# Patient Record
Sex: Female | Born: 1955 | Race: White | Hispanic: No | Marital: Married | Smoking: Never smoker
Health system: Southern US, Community
[De-identification: ages and names within clinical notes are randomized; demographics above are authoritative.]

## PROBLEM LIST (undated history)

## (undated) DIAGNOSIS — J45909 Unspecified asthma, uncomplicated: Secondary | ICD-10-CM

## (undated) DIAGNOSIS — D649 Anemia, unspecified: Secondary | ICD-10-CM

## (undated) DIAGNOSIS — T7840XA Allergy, unspecified, initial encounter: Secondary | ICD-10-CM

## (undated) DIAGNOSIS — K8020 Calculus of gallbladder without cholecystitis without obstruction: Secondary | ICD-10-CM

## (undated) DIAGNOSIS — M8580 Other specified disorders of bone density and structure, unspecified site: Secondary | ICD-10-CM

## (undated) DIAGNOSIS — D0362 Melanoma in situ of left upper limb, including shoulder: Secondary | ICD-10-CM

## (undated) DIAGNOSIS — M1990 Unspecified osteoarthritis, unspecified site: Secondary | ICD-10-CM

## (undated) DIAGNOSIS — K219 Gastro-esophageal reflux disease without esophagitis: Secondary | ICD-10-CM

## (undated) DIAGNOSIS — C801 Malignant (primary) neoplasm, unspecified: Secondary | ICD-10-CM

## (undated) HISTORY — DX: Gastro-esophageal reflux disease without esophagitis: K21.9

## (undated) HISTORY — PX: OTHER SURGICAL HISTORY: SHX169

## (undated) HISTORY — DX: Melanoma in situ of left upper limb, including shoulder: D03.62

## (undated) HISTORY — DX: Unspecified osteoarthritis, unspecified site: M19.90

## (undated) HISTORY — DX: Unspecified asthma, uncomplicated: J45.909

## (undated) HISTORY — PX: ABDOMINAL HYSTERECTOMY: SHX81

## (undated) HISTORY — PX: MELANOMA EXCISION: SHX5266

## (undated) HISTORY — DX: Malignant (primary) neoplasm, unspecified: C80.1

## (undated) HISTORY — DX: Allergy, unspecified, initial encounter: T78.40XA

## (undated) HISTORY — DX: Calculus of gallbladder without cholecystitis without obstruction: K80.20

## (undated) HISTORY — DX: Other specified disorders of bone density and structure, unspecified site: M85.80

## (undated) HISTORY — DX: Anemia, unspecified: D64.9

---

## 1998-11-16 ENCOUNTER — Other Ambulatory Visit: Admission: RE | Admit: 1998-11-16 | Discharge: 1998-11-16 | Payer: Self-pay | Admitting: *Deleted

## 1999-12-04 ENCOUNTER — Other Ambulatory Visit: Admission: RE | Admit: 1999-12-04 | Discharge: 1999-12-04 | Payer: Self-pay | Admitting: *Deleted

## 2000-08-22 ENCOUNTER — Ambulatory Visit (HOSPITAL_COMMUNITY): Admission: RE | Admit: 2000-08-22 | Discharge: 2000-08-22 | Payer: Self-pay | Admitting: Plastic Surgery

## 2000-08-22 ENCOUNTER — Encounter: Payer: Self-pay | Admitting: Plastic Surgery

## 2000-12-03 ENCOUNTER — Other Ambulatory Visit: Admission: RE | Admit: 2000-12-03 | Discharge: 2000-12-03 | Payer: Self-pay | Admitting: *Deleted

## 2001-11-27 ENCOUNTER — Other Ambulatory Visit: Admission: RE | Admit: 2001-11-27 | Discharge: 2001-11-27 | Payer: Self-pay | Admitting: *Deleted

## 2002-07-13 ENCOUNTER — Encounter (INDEPENDENT_AMBULATORY_CARE_PROVIDER_SITE_OTHER): Payer: Self-pay | Admitting: Specialist

## 2002-07-13 ENCOUNTER — Ambulatory Visit (HOSPITAL_BASED_OUTPATIENT_CLINIC_OR_DEPARTMENT_OTHER): Admission: RE | Admit: 2002-07-13 | Discharge: 2002-07-13 | Payer: Self-pay | Admitting: Plastic Surgery

## 2002-12-02 ENCOUNTER — Other Ambulatory Visit: Admission: RE | Admit: 2002-12-02 | Discharge: 2002-12-02 | Payer: Self-pay | Admitting: *Deleted

## 2003-12-01 ENCOUNTER — Other Ambulatory Visit: Admission: RE | Admit: 2003-12-01 | Discharge: 2003-12-01 | Payer: Self-pay | Admitting: *Deleted

## 2004-10-09 ENCOUNTER — Emergency Department (HOSPITAL_COMMUNITY): Admission: EM | Admit: 2004-10-09 | Discharge: 2004-10-09 | Payer: Self-pay | Admitting: Emergency Medicine

## 2004-12-04 ENCOUNTER — Other Ambulatory Visit: Admission: RE | Admit: 2004-12-04 | Discharge: 2004-12-04 | Payer: Self-pay | Admitting: *Deleted

## 2005-12-03 ENCOUNTER — Other Ambulatory Visit: Admission: RE | Admit: 2005-12-03 | Discharge: 2005-12-03 | Payer: Self-pay | Admitting: *Deleted

## 2006-12-04 ENCOUNTER — Other Ambulatory Visit: Admission: RE | Admit: 2006-12-04 | Discharge: 2006-12-04 | Payer: Self-pay | Admitting: *Deleted

## 2007-12-08 ENCOUNTER — Other Ambulatory Visit: Admission: RE | Admit: 2007-12-08 | Discharge: 2007-12-08 | Payer: Self-pay | Admitting: Gynecology

## 2007-12-11 ENCOUNTER — Ambulatory Visit: Payer: Self-pay | Admitting: Gastroenterology

## 2007-12-23 ENCOUNTER — Ambulatory Visit: Payer: Self-pay | Admitting: Gastroenterology

## 2007-12-31 ENCOUNTER — Telehealth: Payer: Self-pay | Admitting: Gastroenterology

## 2008-01-26 DIAGNOSIS — K5909 Other constipation: Secondary | ICD-10-CM

## 2008-01-26 DIAGNOSIS — R519 Headache, unspecified: Secondary | ICD-10-CM | POA: Insufficient documentation

## 2008-01-26 DIAGNOSIS — R51 Headache: Secondary | ICD-10-CM

## 2008-01-26 DIAGNOSIS — J45909 Unspecified asthma, uncomplicated: Secondary | ICD-10-CM | POA: Insufficient documentation

## 2008-01-27 ENCOUNTER — Ambulatory Visit: Payer: Self-pay | Admitting: Gastroenterology

## 2008-01-27 DIAGNOSIS — K5900 Constipation, unspecified: Secondary | ICD-10-CM | POA: Insufficient documentation

## 2008-01-27 DIAGNOSIS — C439 Malignant melanoma of skin, unspecified: Secondary | ICD-10-CM | POA: Insufficient documentation

## 2008-01-27 LAB — CONVERTED CEMR LAB
ALT: 15 units/L (ref 0–35)
AST: 23 units/L (ref 0–37)
Albumin: 4.1 g/dL (ref 3.5–5.2)
Alkaline Phosphatase: 82 units/L (ref 39–117)
BUN: 12 mg/dL (ref 6–23)
Basophils Absolute: 0 10*3/uL (ref 0.0–0.1)
Basophils Relative: 0.1 % (ref 0.0–3.0)
Bilirubin, Direct: 0.2 mg/dL (ref 0.0–0.3)
CO2: 29 meq/L (ref 19–32)
Calcium: 9.4 mg/dL (ref 8.4–10.5)
Chloride: 106 meq/L (ref 96–112)
Creatinine, Ser: 0.7 mg/dL (ref 0.4–1.2)
Eosinophils Absolute: 0.1 10*3/uL (ref 0.0–0.7)
Eosinophils Relative: 2.3 % (ref 0.0–5.0)
GFR calc Af Amer: 113 mL/min
GFR calc non Af Amer: 93 mL/min
Glucose, Bld: 98 mg/dL (ref 70–99)
HCT: 40.1 % (ref 36.0–46.0)
Hemoglobin: 13.7 g/dL (ref 12.0–15.0)
Lymphocytes Relative: 39.8 % (ref 12.0–46.0)
MCHC: 34.1 g/dL (ref 30.0–36.0)
MCV: 92.5 fL (ref 78.0–100.0)
Monocytes Absolute: 0.7 10*3/uL (ref 0.1–1.0)
Monocytes Relative: 10.4 % (ref 3.0–12.0)
Neutro Abs: 3 10*3/uL (ref 1.4–7.7)
Neutrophils Relative %: 47.4 % (ref 43.0–77.0)
Platelets: 275 10*3/uL (ref 150–400)
Potassium: 4.1 meq/L (ref 3.5–5.1)
RBC: 4.34 M/uL (ref 3.87–5.11)
RDW: 12 % (ref 11.5–14.6)
Sed Rate: 13 mm/hr (ref 0–22)
Sodium: 141 meq/L (ref 135–145)
TSH: 1.63 microintl units/mL (ref 0.35–5.50)
Tissue Transglutaminase Ab, IgA: 1.1 units (ref <7)
Total Bilirubin: 1 mg/dL (ref 0.3–1.2)
Total Protein: 7 g/dL (ref 6.0–8.3)
WBC: 6.3 10*3/uL (ref 4.5–10.5)

## 2008-03-02 ENCOUNTER — Ambulatory Visit: Payer: Self-pay | Admitting: Gastroenterology

## 2010-09-29 NOTE — Op Note (Signed)
   NAME:  Heather Fuller, Heather Fuller                           ACCOUNT NO.:  [id number]   MEDICAL RECORD NO.:  [id number]                   PATIENT TYPE:  AMB   LOCATION:  DSC                                  FACILITY:  [facility]   PHYSICIAN:  Etter Sjogren, M.D.                  DATE OF BIRTH:  09-Aug-1955   DATE OF PROCEDURE:  07/13/2002  DATE OF DISCHARGE:                                 OPERATIVE REPORT   PREOPERATIVE DIAGNOSIS:  Basal cell carcinoma, right shoulder/neck, greater  than 0.5 cm in diameter.   POSTOPERATIVE DIAGNOSES:  1. Basal cell carcinoma, right shoulder/neck, greater than 0.5 cm in     diameter.  2. Complicated open wound of the neck, 2.5 cm, necessitated by excision of     the cancer.   PROCEDURE PERFORMED:  1. Excision basal cell carcinoma at the junction of the shoulder and neck     greater than 0.5 cm.  2. Complex wound closure at the junction of the neck and shoulder, 2.5 cm.   SURGEON:  Etter Sjogren, M.D.   ANESTHESIA:  1%  Xylocaine with epinephrine plus bicarb.   INDICATIONS:  This 55 year old woman has a history of skin cancers including  a melanoma.  She now has a basal cell carcinoma located on the right side of  her neck/shoulder.  She presents for a wider excision of this area.   The procedure and risks were well understood by her including scarring and  the possibility of further surgery depending upon the final pathology  report.  She wished to proceed.   DESCRIPTION OF PROCEDURE:  The patient was placed supine.  The excision was  marked with margins of grossly-normal tissue.  She was then prepped with  Betadine and draped with sterile drapes.  Satisfactory local anesthesia was  achieved, and the excision was performed.  Thorough irrigation and a layered  closure with 4-0 Monocryl interrupted inverted deep sutures and 4-0 Monocryl  interrupted inverted dep dermal and 4-0 Monocryl running subcuticular  suture.  Steri-Strips and a dry sterile dressing were  applied.  She  tolerated the procedure well.   DISPOSITION:  She will avoid vigorous activities for a week, and she will  see me in the office next week for recheck.                                               Etter Sjogren, M.D.    DB/MEDQ  D:  07/13/2002  T:  07/13/2002  Job:  [id number]

## 2011-01-02 ENCOUNTER — Other Ambulatory Visit: Payer: Self-pay | Admitting: Family Medicine

## 2011-01-02 DIAGNOSIS — R221 Localized swelling, mass and lump, neck: Secondary | ICD-10-CM

## 2011-01-08 ENCOUNTER — Ambulatory Visit
Admission: RE | Admit: 2011-01-08 | Discharge: 2011-01-08 | Disposition: A | Discharge status: Home / Self Care | Payer: BC Managed Care – PPO | Source: Ambulatory Visit | Attending: Family Medicine | Admitting: Family Medicine

## 2011-01-08 DIAGNOSIS — R221 Localized swelling, mass and lump, neck: Secondary | ICD-10-CM

## 2013-01-13 ENCOUNTER — Other Ambulatory Visit (INDEPENDENT_AMBULATORY_CARE_PROVIDER_SITE_OTHER): Payer: BC Managed Care – PPO

## 2013-01-13 ENCOUNTER — Encounter: Payer: Self-pay | Admitting: Physician Assistant

## 2013-01-13 ENCOUNTER — Telehealth: Payer: Self-pay | Admitting: Gastroenterology

## 2013-01-13 ENCOUNTER — Ambulatory Visit (INDEPENDENT_AMBULATORY_CARE_PROVIDER_SITE_OTHER): Payer: BC Managed Care – PPO | Admitting: Physician Assistant

## 2013-01-13 VITALS — BP 116/72 | HR 80 | Ht 66.0 in | Wt 170.0 lb

## 2013-01-13 DIAGNOSIS — R109 Unspecified abdominal pain: Secondary | ICD-10-CM

## 2013-01-13 DIAGNOSIS — R1031 Right lower quadrant pain: Secondary | ICD-10-CM

## 2013-01-13 LAB — CBC WITH DIFFERENTIAL/PLATELET
Eosinophils Absolute: 0.1 10*3/uL (ref 0.0–0.7)
MCHC: 33.9 g/dL (ref 30.0–36.0)
MCV: 89.9 fl (ref 78.0–100.0)
Monocytes Absolute: 0.7 10*3/uL (ref 0.1–1.0)
Neutrophils Relative %: 60.2 % (ref 43.0–77.0)
Platelets: 281 10*3/uL (ref 150.0–400.0)
WBC: 7.3 10*3/uL (ref 4.5–10.5)

## 2013-01-13 LAB — COMPREHENSIVE METABOLIC PANEL
AST: 20 U/L (ref 0–37)
Albumin: 4.3 g/dL (ref 3.5–5.2)
Alkaline Phosphatase: 81 U/L (ref 39–117)
Potassium: 3.4 mEq/L — ABNORMAL LOW (ref 3.5–5.1)
Sodium: 135 mEq/L (ref 135–145)
Total Protein: 7.5 g/dL (ref 6.0–8.3)

## 2013-01-13 NOTE — Patient Instructions (Addendum)
Please go to the basement level to have your labs drawn.   You have been scheduled for a CT scan of the abdomen and pelvis at [facility] ([address]---this is in the same building as [facility]).   You are scheduled on 01-14-2013 at 3:30 PM . You should arrive at 3:15 PM  prior to your appointment time for registration. Please follow the written instructions below on the day of your exam:  WARNING: IF YOU ARE ALLERGIC TO IODINE/X-RAY DYE, PLEASE NOTIFY RADIOLOGY IMMEDIATELY AT [phone]! YOU WILL BE GIVEN A 13 HOUR PREMEDICATION PREP.  1) Do not eat or drink anything after 11:30 am  (4 hours prior to your test) 2) You have been given 2 bottles of oral contrast to drink. The solution may taste better if refrigerated, but do NOT add ice or any other liquid to this solution. Shake well before drinking.    Drink 1 bottle of contrast @ 1;30 PM  (2 hours prior to your exam)  Drink 1 bottle of contrast @ 2:30 PM  (1 hour prior to your exam)  You may take any medications as prescribed with a small amount of water except for the following: Metformin, Glucophage, Glucovance, Avandamet, Riomet, Fortamet, Actoplus Met, Janumet, Glumetza or Metaglip. The above medications must be held the day of the exam AND 48 hours after the exam.  The purpose of you drinking the oral contrast is to aid in the visualization of your intestinal tract. The contrast solution may cause some diarrhea. Before your exam is started, you will be given a small amount of fluid to drink. Depending on your individual set of symptoms, you may also receive an intravenous injection of x-ray contrast/dye. Plan on being at [facility] for 30 minutes or long, depending on the type of exam you are having performed.  If you have any questions regarding your exam or if you need to reschedule, you may call the CT department at [phone] between the hours of 8:00 am and 5:00 pm,  Monday-Friday.  ________________________________________________________________________

## 2013-01-13 NOTE — Progress Notes (Signed)
Subjective:    Patient ID: Heather Fuller, female    DOB: 1955-12-17, 57 y.o.   MRN: [id number]  HPI  Heather Fuller is a pleasant generally healthy 57 year old white female known remotely to Dr. Jarold Motto from screening colonoscopy done in 2009. This was a normal exam. She does have a remote history of a melanoma removed from her shoulder and is status post total abdominal hysterectomy with history of endometriosis. Patient comes in today with complaints of right lower quadrant pain She had been to see her gynecologist earlier in the day for the same pain and had a pelvic ultrasound done through Dr. Corwin Levins office and was told that she had gallstones. Apparently there was no other explanation seen on the pelvic ultrasound to explain pain. Patient says she's not sure the pain has anything to do with her gallstones. She says she has had a dull right lower abdominal pain which occasionally radiates through into her back and has been present since December 23 of 2013. She again describes this as a dull pain without radiation. She says it has been intermittent over the past several months but has become more constant over the past few weeks. She feels that it is aggravated by exercise. She has not noticed any change with bowel movements or by mouth intake. She says her bowel habits have been fairly normal for her which is to have some mild constipation. She has not noted any melena or hematochezia. Her appetite has been fine, her weight has been stable. We are unable to get a formal report from the ultrasound done at the GYN office. Patient has a picture of an ultrasound image on her phone which appears to show gallstones     Review of Systems  Constitutional: Negative.   HENT: Negative.   Eyes: Negative.   Cardiovascular: Negative.   Gastrointestinal: Positive for abdominal pain and constipation.  Endocrine: Negative.   Genitourinary: Negative.   Musculoskeletal: Negative.   Skin: Negative.    Allergic/Immunologic: Negative.   Neurological: Negative.   Hematological: Negative.   Psychiatric/Behavioral: Negative.    Outpatient Encounter Prescriptions as of 01/13/2013  Medication Sig Dispense Refill  . budesonide-formoterol (SYMBICORT) 160-4.5 MCG/ACT inhaler Inhale 2 puffs into the lungs as needed.      . levalbuterol (XOPENEX HFA) 45 MCG/ACT inhaler Inhale 1-2 puffs into the lungs as needed for wheezing.      Marland Kitchen levocetirizine (XYZAL) 5 MG tablet Take 5 mg by mouth every evening.      . montelukast (SINGULAIR) 10 MG tablet Take 10 mg by mouth at bedtime.      Marland Kitchen omeprazole (PRILOSEC) 20 MG capsule Take 20 mg by mouth daily.       No facility-administered encounter medications on file as of 01/13/2013.       Allergies  Allergen Reactions  . Morphine     REACTION: severe nauseaa and vomiting   Patient Active Problem List   Diagnosis Date Noted  . MELANOMA OF SKIN, SITE UNSPECIFIED 01/27/2008  . ASTHMA 01/26/2008  . CONSTIPATION, CHRONIC 01/26/2008  . HEADACHE 01/26/2008   History  Substance Use Topics  . Smoking status: Never Smoker   . Smokeless tobacco: Never Used  . Alcohol Use: Yes     Comment: one a day   family history includes Diabetes in her mother.  Objective:   Physical Exam well-developed white female in no acute distress, pleasant blood pressure 116/72 pulse 80 height 5 foot 6 weight 170. HEENT; nontraumatic normocephalic EOMI PERRLA sclera  anicteric, Supple; no JVD, Cardiovascular; regular rate and rhythm with S1-S2 no murmur or gallop, Pulmonary; clear bilaterally, Abdomen; soft , bowel sounds are present, she is tender in the right mid quadrant right lower cautery to is no guarding or rebound no palpable mass or hepatosplenomegaly, Rectal; exam not done, Extremities; no clubbing cyanosis or edema skin warm and dry, Psych; mood and affect normal and appropriate.        Assessment & Plan:  #60 57 year old white female with 8 month history of right lower  quadrant abdominal pain somewhat progressive and more constant. Pain is at a rate of by exercise. She has no other constitutional symptoms. Etiology is not clear. Her pain is not typical for biliary colic, she may have discomfort secondary to adhesions or other occult lesion #2 cholelithiasis by pelvic ultrasound done at her gynecologist-am not convinced the cholelithiasis as a cause of her abdominal pain #3 status post total abdominal hysterectomy with history of endometriosis #4 remote history of melanoma / shoulder   Plan; CBC with differential and cmet today Schedule for CT scan of the abdomen and pelvis with contrast Further plans pending findings of about

## 2013-01-13 NOTE — Telephone Encounter (Signed)
Pt reports she was seen at Dr Corwin [facility]ins [facility] and gallstones were detected and he referred her here. Pt will be seen by Mike Gip, PA at 2:30pm. Called Dr [facility] for U/S and they are closed for lunch. [phone].  Called Dr [facility] and they do not do formal reports; Amy is aware.

## 2013-01-14 ENCOUNTER — Ambulatory Visit (INDEPENDENT_AMBULATORY_CARE_PROVIDER_SITE_OTHER)
Admission: RE | Admit: 2013-01-14 | Discharge: 2013-01-14 | Disposition: A | Discharge status: Home / Self Care | Payer: BC Managed Care – PPO | Source: Ambulatory Visit | Attending: Physician Assistant | Admitting: Physician Assistant

## 2013-01-14 DIAGNOSIS — R109 Unspecified abdominal pain: Secondary | ICD-10-CM

## 2013-01-14 DIAGNOSIS — R1031 Right lower quadrant pain: Secondary | ICD-10-CM

## 2013-01-14 MED ORDER — IOHEXOL 300 MG/ML  SOLN
100.0000 mL | Freq: Once | INTRAMUSCULAR | Status: AC | PRN
  Administered 2013-01-14: 100 mL via INTRAVENOUS

## 2013-01-19 ENCOUNTER — Other Ambulatory Visit: Payer: Self-pay | Admitting: *Deleted

## 2013-01-19 ENCOUNTER — Encounter: Payer: Self-pay | Admitting: Physician Assistant

## 2013-01-19 DIAGNOSIS — K8020 Calculus of gallbladder without cholecystitis without obstruction: Secondary | ICD-10-CM

## 2013-01-27 ENCOUNTER — Ambulatory Visit (INDEPENDENT_AMBULATORY_CARE_PROVIDER_SITE_OTHER): Payer: BC Managed Care – PPO | Admitting: General Surgery

## 2013-03-19 ENCOUNTER — Other Ambulatory Visit: Payer: Self-pay

## 2014-10-22 ENCOUNTER — Telehealth: Payer: Self-pay | Admitting: *Deleted

## 2014-10-22 ENCOUNTER — Ambulatory Visit (INDEPENDENT_AMBULATORY_CARE_PROVIDER_SITE_OTHER): Payer: BC Managed Care – PPO | Admitting: Podiatry

## 2014-10-22 ENCOUNTER — Encounter: Payer: Self-pay | Admitting: Podiatry

## 2014-10-22 ENCOUNTER — Ambulatory Visit (INDEPENDENT_AMBULATORY_CARE_PROVIDER_SITE_OTHER): Payer: BC Managed Care – PPO

## 2014-10-22 VITALS — BP 131/74 | HR 66 | Resp 16

## 2014-10-22 DIAGNOSIS — M722 Plantar fascial fibromatosis: Secondary | ICD-10-CM | POA: Diagnosis not present

## 2014-10-22 MED ORDER — TRIAMCINOLONE ACETONIDE 10 MG/ML IJ SUSP
10.0000 mg | Freq: Once | INTRAMUSCULAR | Status: AC
  Administered 2014-10-22: 10 mg

## 2014-10-22 MED ORDER — DICLOFENAC SODIUM 75 MG PO TBEC: 75.0000 mg | DELAYED_RELEASE_TABLET | Freq: Two times a day (BID) | ORAL | Status: DC

## 2014-10-22 NOTE — Telephone Encounter (Signed)
Called in.

## 2014-10-22 NOTE — Patient Instructions (Signed)

## 2014-10-22 NOTE — Progress Notes (Signed)
° °  Subjective:    Patient ID: Heather Fuller, female    DOB: Jan 24, 1956, 59 y.o.   MRN: [id number]  HPI   N: Pain and ache (can't bend foot in the am) L: B/L foot D: 3-4 weeks O: Suddenly C: effects her walking in the morning for about 11mins, swelling A: Sleeping (only happens when when wakes up in the morning or sitting for a long period of time) T: ice every night, aleve    Review of Systems  Respiratory: Positive for wheezing.   Musculoskeletal:       Muscle pain  Allergic/Immunologic: Positive for environmental allergies.  Neurological: Positive for headaches.  Hematological: Bruises/bleeds easily.  All other systems reviewed and are negative.      Objective:   Physical Exam        Assessment & Plan:

## 2014-10-22 NOTE — Telephone Encounter (Signed)
"  I was just there.  He said he was gong to put me on an anti-inflammatory.  I did not receive that prescription. Is he going to call it into [facility] or do I need to to come back to get that?"

## 2014-10-22 NOTE — Telephone Encounter (Signed)
I called and informed patient that Dr. Paulla Dolly sent the prescription to the pharmacy.  "Okay, thanks for calling me back."

## 2014-10-25 NOTE — Progress Notes (Signed)
Subjective:     Patient ID: Heather Fuller, female   DOB: 1955-06-19, 59 y.o.   MRN: [id number]  HPI patient presents stating she's had a lot of pain in her heels for around a month and it difficult for her to walk comfortably. States it's worse when she gets up in the morning or after any periods of sitting   Review of Systems  All other systems reviewed and are negative.      Objective:   Physical Exam  Constitutional: She is oriented to person, place, and time.  Cardiovascular: Intact distal pulses.   Musculoskeletal: Normal range of motion.  Neurological: She is oriented to person, place, and time.  Skin: Skin is warm.  Nursing note and vitals reviewed.  neurovascular status intact muscle strength adequate with range of motion within normal limits. Patient's noted to have quite a bit of discomfort in the plantar heel bilateral with inflammation and fluid around the medial band at its insertion into the calcaneus. Patient has good digital perfusion is well oriented 3     Assessment:     Acute plantar fasciitis bilateral with swelling noted at the insertion into the calcaneus    Plan:     H&P and conditions reviewed and today I injected the plantar fascia bilateral 3 mg Kenalog 5 mg Xylocaine and advised on physical therapy and shoe gear support. Reappoint to recheck

## 2015-05-25 ENCOUNTER — Encounter: Payer: Self-pay | Admitting: Gastroenterology

## 2017-10-22 ENCOUNTER — Encounter: Payer: Self-pay | Admitting: Gastroenterology

## 2017-10-24 ENCOUNTER — Encounter: Payer: Self-pay | Admitting: Gastroenterology

## 2017-11-15 ENCOUNTER — Encounter: Payer: Self-pay | Admitting: *Deleted

## 2017-12-03 ENCOUNTER — Ambulatory Visit (AMBULATORY_SURGERY_CENTER): Payer: BC Managed Care – PPO | Admitting: *Deleted

## 2017-12-03 VITALS — Ht 66.0 in | Wt 146.8 lb

## 2017-12-03 DIAGNOSIS — Z1211 Encounter for screening for malignant neoplasm of colon: Secondary | ICD-10-CM

## 2017-12-03 MED ORDER — PLENVU 140 G PO SOLR: 1.0000 | Freq: Once | ORAL | 0 refills | Status: AC

## 2017-12-03 NOTE — Progress Notes (Signed)
No egg or soy allergy known to patient  No issues with past sedation with any surgeries  or procedures, no intubation problems  No diet pills per patient No home 02 use per patient  No blood thinners per patient  Pt has  issues with constipation  No A fib or A flutter  EMMI video offered , patient denied Patient stating she needs a smaller ETT for intubation. Patient denies being told that she was a difficult intubation or has any paperwork to present regarding difficult airway.

## 2017-12-10 ENCOUNTER — Encounter: Payer: Self-pay | Admitting: Gastroenterology

## 2017-12-20 ENCOUNTER — Ambulatory Visit (AMBULATORY_SURGERY_CENTER): Payer: BC Managed Care – PPO | Admitting: Gastroenterology

## 2017-12-20 ENCOUNTER — Encounter: Payer: Self-pay | Admitting: Gastroenterology

## 2017-12-20 VITALS — BP 105/74 | HR 66 | Temp 98.0°F | Resp 17 | Ht 66.0 in | Wt 146.0 lb

## 2017-12-20 DIAGNOSIS — D124 Benign neoplasm of descending colon: Secondary | ICD-10-CM

## 2017-12-20 DIAGNOSIS — Z1211 Encounter for screening for malignant neoplasm of colon: Secondary | ICD-10-CM

## 2017-12-20 MED ORDER — SODIUM CHLORIDE 0.9 % IV SOLN: 500.0000 mL | Freq: Once | INTRAVENOUS | Status: DC

## 2017-12-20 NOTE — Progress Notes (Signed)
Called to room to assist during endoscopic procedure.  Patient ID and intended procedure confirmed with present staff. Received instructions for my participation in the procedure from the performing physician.  

## 2017-12-20 NOTE — Progress Notes (Signed)
A and O x3. Report to RN. Tolerated MAC anesthesia well.

## 2017-12-20 NOTE — Patient Instructions (Signed)
Handout given on polyps  YOU HAD AN ENDOSCOPIC PROCEDURE TODAY AT THE [facility]:   Refer to the procedure report that was given to you for any specific questions about what was found during the examination.  If the procedure report does not answer your questions, please call your gastroenterologist to clarify.  If you requested that your care partner not be given the details of your procedure findings, then the procedure report has been included in a sealed envelope for you to review at your convenience later.  YOU SHOULD EXPECT: Some feelings of bloating in the abdomen. Passage of more gas than usual.  Walking can help get rid of the air that was put into your GI tract during the procedure and reduce the bloating. If you had a lower endoscopy (such as a colonoscopy or flexible sigmoidoscopy) you may notice spotting of blood in your stool or on the toilet paper. If you underwent a bowel prep for your procedure, you may not have a normal bowel movement for a few days.  Please Note:  You might notice some irritation and congestion in your nose or some drainage.  This is from the oxygen used during your procedure.  There is no need for concern and it should clear up in a day or so.  SYMPTOMS TO REPORT IMMEDIATELY:   Following lower endoscopy (colonoscopy or flexible sigmoidoscopy):  Excessive amounts of blood in the stool  Significant tenderness or worsening of abdominal pains  Swelling of the abdomen that is new, acute  Fever of 100F or higher    For urgent or emergent issues, a gastroenterologist can be reached at any hour by calling [phone].   DIET:  We do recommend a small meal at first, but then you may proceed to your regular diet.  Drink plenty of fluids but you should avoid alcoholic beverages for 24 hours.  ACTIVITY:  You should plan to take it easy for the rest of today and you should NOT DRIVE or use heavy machinery until tomorrow (because of the sedation  medicines used during the test).    FOLLOW UP: Our staff will call the number listed on your records the next business day following your procedure to check on you and address any questions or concerns that you may have regarding the information given to you following your procedure. If we do not reach you, we will leave a message.  However, if you are feeling well and you are not experiencing any problems, there is no need to return our call.  We will assume that you have returned to your regular daily activities without incident.  If any biopsies were taken you will be contacted by phone or by letter within the next 1-3 weeks.  Please call us at [phone] if you have not heard about the biopsies in 3 weeks.    SIGNATURES/CONFIDENTIALITY: You and/or your care partner have signed paperwork which will be entered into your electronic medical record.  These signatures attest to the fact that that the information above on your After Visit Summary has been reviewed and is understood.  Full responsibility of the confidentiality of this discharge information lies with you and/or your care-partner. 

## 2017-12-20 NOTE — Op Note (Signed)
Heather Fuller Patient Name: Heather Fuller Procedure Date: 12/20/2017 11:38 AM MRN: [id number] Endoscopist: Mallie Mussel L. Loletha Carrow , MD Age: 62 Referring MD:  Date of Birth: 05/18/1955 Gender: Female Account #: [id number] Procedure:                Colonoscopy Indications:              Screening for colorectal malignant neoplasm (No                            polyps on last colonoscopy 12/2007) Medicines:                Monitored Anesthesia Care Procedure:                Pre-Anesthesia Assessment:                           - Prior to the procedure, a History and Physical                            was performed, and patient medications and                            allergies were reviewed. The patient's tolerance of                            previous anesthesia was also reviewed. The risks                            and benefits of the procedure and the sedation                            options and risks were discussed with the patient.                            All questions were answered, and informed consent                            was obtained. Prior Anticoagulants: The patient has                            taken no previous anticoagulant or antiplatelet                            agents. ASA Grade Assessment: II - A patient with                            mild systemic disease. After reviewing the risks                            and benefits, the patient was deemed in                            satisfactory condition to undergo the procedure.  After obtaining informed consent, the colonoscope                            was passed under direct vision. Throughout the                            procedure, the patient's blood pressure, pulse, and                            oxygen saturations were monitored continuously. The                            Colonoscope was introduced through the anus and                            advanced to the the cecum,  identified by                            appendiceal orifice and ileocecal valve. The                            colonoscopy was performed without difficulty. The                            patient tolerated the procedure well. The quality                            of the bowel preparation was good. The ileocecal                            valve, appendiceal orifice, and rectum were                            photographed. Scope In: 11:42:56 AM Scope Out: 83:41:96 AM Scope Withdrawal Time: 0 hours 9 minutes 10 seconds  Total Procedure Duration: 0 hours 13 minutes 46 seconds  Findings:                 The perianal and digital rectal examinations were                            normal.                           A 5 mm polyp was found in the descending colon. The                            polyp was sessile. The polyp was removed with a                            cold snare. Resection and retrieval were complete.                           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  The exam was otherwise without abnormality on                            direct and retroflexion views. Complications:            No immediate complications. Estimated Blood Loss:     Estimated blood loss was minimal. Impression:               - One 5 mm polyp in the descending colon, removed                            with a cold snare. Resected and retrieved.                           - Melanosis in the colon.                           - The examination was otherwise normal on direct                            and retroflexion views. Recommendation:           - Patient has a contact number available for                            emergencies. The signs and symptoms of potential                            delayed complications were discussed with the                            patient. Return to normal activities tomorrow.                            Written discharge  instructions were provided to the                            patient.                           - Resume previous diet.                           - Continue present medications.                           - Await pathology results.                           - Repeat colonoscopy is recommended for                            surveillance. The colonoscopy date will be                            determined after pathology results from today's  exam become available for review. Maziyah Vessel L. Loletha Carrow, MD 12/20/2017 11:59:36 AM This report has been signed electronically.

## 2017-12-20 NOTE — Progress Notes (Signed)
Pt's states no medical or surgical changes since previsit or office visit. 

## 2017-12-23 ENCOUNTER — Telehealth: Payer: Self-pay | Admitting: *Deleted

## 2017-12-23 NOTE — Telephone Encounter (Signed)
  Follow up Call-  Call back number 12/20/2017  Post procedure Call Back phone  # [phone]  Permission to leave phone message Yes  Some recent data might be hidden     Patient questions:  Do you have a fever, pain , or abdominal swelling? No. Pain Score  0 *  Have you tolerated food without any problems? yes  Have you been able to return to your normal activities? yes  Do you have any questions about your discharge instructions: Diet   No. Medications  No. Follow up visit  No.  Do you have questions or concerns about your Care? No.  Actions: * If pain score is 4 or above: No action needed, pain <4.

## 2017-12-27 ENCOUNTER — Encounter: Payer: Self-pay | Admitting: Gastroenterology

## 2019-07-05 ENCOUNTER — Encounter (HOSPITAL_COMMUNITY): Payer: Self-pay | Admitting: Emergency Medicine

## 2019-07-05 ENCOUNTER — Observation Stay (HOSPITAL_COMMUNITY)
Admission: EM | Admit: 2019-07-05 | Discharge: 2019-07-07 | Disposition: A | Discharge status: Home / Self Care | Payer: Medicare PPO | Attending: General Surgery | Admitting: General Surgery

## 2019-07-05 ENCOUNTER — Emergency Department (HOSPITAL_COMMUNITY): Payer: Medicare PPO

## 2019-07-05 ENCOUNTER — Other Ambulatory Visit: Payer: Self-pay

## 2019-07-05 DIAGNOSIS — J45909 Unspecified asthma, uncomplicated: Secondary | ICD-10-CM | POA: Diagnosis not present

## 2019-07-05 DIAGNOSIS — K8012 Calculus of gallbladder with acute and chronic cholecystitis without obstruction: Secondary | ICD-10-CM | POA: Diagnosis not present

## 2019-07-05 DIAGNOSIS — K8010 Calculus of gallbladder with chronic cholecystitis without obstruction: Secondary | ICD-10-CM | POA: Diagnosis present

## 2019-07-05 DIAGNOSIS — R17 Unspecified jaundice: Secondary | ICD-10-CM

## 2019-07-05 DIAGNOSIS — Z20822 Contact with and (suspected) exposure to covid-19: Secondary | ICD-10-CM | POA: Insufficient documentation

## 2019-07-05 DIAGNOSIS — Z7951 Long term (current) use of inhaled steroids: Secondary | ICD-10-CM | POA: Insufficient documentation

## 2019-07-05 DIAGNOSIS — Z79899 Other long term (current) drug therapy: Secondary | ICD-10-CM | POA: Insufficient documentation

## 2019-07-05 DIAGNOSIS — Z91040 Latex allergy status: Secondary | ICD-10-CM | POA: Insufficient documentation

## 2019-07-05 DIAGNOSIS — R1013 Epigastric pain: Secondary | ICD-10-CM

## 2019-07-05 DIAGNOSIS — K8063 Calculus of gallbladder and bile duct with acute cholecystitis with obstruction: Secondary | ICD-10-CM

## 2019-07-05 DIAGNOSIS — Z85820 Personal history of malignant melanoma of skin: Secondary | ICD-10-CM | POA: Diagnosis not present

## 2019-07-05 DIAGNOSIS — Z885 Allergy status to narcotic agent status: Secondary | ICD-10-CM | POA: Insufficient documentation

## 2019-07-05 DIAGNOSIS — K769 Liver disease, unspecified: Secondary | ICD-10-CM | POA: Insufficient documentation

## 2019-07-05 DIAGNOSIS — Z419 Encounter for procedure for purposes other than remedying health state, unspecified: Secondary | ICD-10-CM

## 2019-07-05 DIAGNOSIS — K56609 Unspecified intestinal obstruction, unspecified as to partial versus complete obstruction: Secondary | ICD-10-CM

## 2019-07-05 LAB — CBC WITH DIFFERENTIAL/PLATELET
Abs Immature Granulocytes: 0.2 10*3/uL — ABNORMAL HIGH (ref 0.00–0.07)
Basophils Absolute: 0 10*3/uL (ref 0.0–0.1)
Basophils Relative: 0 %
Eosinophils Absolute: 0.1 10*3/uL (ref 0.0–0.5)
Eosinophils Relative: 0 %
HCT: 41.6 % (ref 36.0–46.0)
Hemoglobin: 13.9 g/dL (ref 12.0–15.0)
Immature Granulocytes: 1 %
Lymphocytes Relative: 3 %
Lymphs Abs: 0.7 10*3/uL (ref 0.7–4.0)
MCH: 31.6 pg (ref 26.0–34.0)
MCHC: 33.4 g/dL (ref 30.0–36.0)
MCV: 94.5 fL (ref 80.0–100.0)
Monocytes Absolute: 1.5 10*3/uL — ABNORMAL HIGH (ref 0.1–1.0)
Monocytes Relative: 6 %
Neutro Abs: 20.9 10*3/uL — ABNORMAL HIGH (ref 1.7–7.7)
Neutrophils Relative %: 90 %
Platelets: 240 10*3/uL (ref 150–400)
RBC: 4.4 MIL/uL (ref 3.87–5.11)
RDW: 12.1 % (ref 11.5–15.5)
WBC: 23.5 10*3/uL — ABNORMAL HIGH (ref 4.0–10.5)
nRBC: 0 % (ref 0.0–0.2)

## 2019-07-05 LAB — COMPREHENSIVE METABOLIC PANEL
ALT: 293 U/L — ABNORMAL HIGH (ref 0–44)
AST: 296 U/L — ABNORMAL HIGH (ref 15–41)
Albumin: 4.2 g/dL (ref 3.5–5.0)
Alkaline Phosphatase: 130 U/L — ABNORMAL HIGH (ref 38–126)
Anion gap: 11 (ref 5–15)
BUN: 14 mg/dL (ref 8–23)
CO2: 27 mmol/L (ref 22–32)
Calcium: 9.9 mg/dL (ref 8.9–10.3)
Chloride: 100 mmol/L (ref 98–111)
Creatinine, Ser: 0.86 mg/dL (ref 0.44–1.00)
GFR calc Af Amer: >60 mL/min (ref >60)
GFR calc non Af Amer: >60 mL/min (ref >60)
Glucose, Bld: 147 mg/dL — ABNORMAL HIGH (ref 70–99)
Potassium: 3.5 mmol/L (ref 3.5–5.1)
Sodium: 138 mmol/L (ref 135–145)
Total Bilirubin: 2.3 mg/dL — ABNORMAL HIGH (ref 0.3–1.2)
Total Protein: 7.4 g/dL (ref 6.5–8.1)

## 2019-07-05 LAB — URINALYSIS, ROUTINE W REFLEX MICROSCOPIC
Bilirubin Urine: NEGATIVE
Glucose, UA: NEGATIVE mg/dL
Hgb urine dipstick: NEGATIVE
Ketones, ur: NEGATIVE mg/dL
Leukocytes,Ua: NEGATIVE
Nitrite: NEGATIVE
Protein, ur: NEGATIVE mg/dL
Specific Gravity, Urine: 1.018 (ref 1.005–1.030)
pH: 9 — ABNORMAL HIGH (ref 5.0–8.0)

## 2019-07-05 LAB — SURGICAL PCR SCREEN
MRSA, PCR: NEGATIVE
Staphylococcus aureus: NEGATIVE

## 2019-07-05 LAB — SARS CORONAVIRUS 2 (TAT 6-24 HRS): SARS Coronavirus 2: NEGATIVE

## 2019-07-05 LAB — LIPASE, BLOOD: Lipase: 1824 U/L — ABNORMAL HIGH (ref 11–51)

## 2019-07-05 MED ORDER — KETOROLAC TROMETHAMINE 15 MG/ML IJ SOLN: 15.0000 mg | Freq: Four times a day (QID) | INTRAMUSCULAR | Status: DC | PRN

## 2019-07-05 MED ORDER — FENTANYL CITRATE (PF) 100 MCG/2ML IJ SOLN
50.0000 ug | Freq: Once | INTRAMUSCULAR | Status: AC
  Administered 2019-07-05: 12:00:00 50 ug via INTRAVENOUS
  Filled 2019-07-05: qty 2

## 2019-07-05 MED ORDER — FAMOTIDINE IN NACL 20-0.9 MG/50ML-% IV SOLN
20.0000 mg | Freq: Once | INTRAVENOUS | Status: AC
  Administered 2019-07-05: 10:00:00 20 mg via INTRAVENOUS
  Filled 2019-07-05: qty 50

## 2019-07-05 MED ORDER — ONDANSETRON HCL 4 MG/2ML IJ SOLN
4.0000 mg | Freq: Four times a day (QID) | INTRAMUSCULAR | Status: DC | PRN
  Administered 2019-07-05: 4 mg via INTRAVENOUS
  Filled 2019-07-05: qty 2

## 2019-07-05 MED ORDER — DIPHENHYDRAMINE HCL 50 MG/ML IJ SOLN: 12.5000 mg | Freq: Four times a day (QID) | INTRAMUSCULAR | Status: DC | PRN

## 2019-07-05 MED ORDER — FENTANYL CITRATE (PF) 100 MCG/2ML IJ SOLN
50.0000 ug | Freq: Once | INTRAMUSCULAR | Status: AC
  Administered 2019-07-05: 10:00:00 50 ug via INTRAVENOUS
  Filled 2019-07-05: qty 2

## 2019-07-05 MED ORDER — DIPHENHYDRAMINE HCL 12.5 MG/5ML PO ELIX
12.5000 mg | ORAL_SOLUTION | Freq: Four times a day (QID) | ORAL | Status: DC | PRN
  Filled 2019-07-05: qty 5

## 2019-07-05 MED ORDER — LEVOCETIRIZINE DIHYDROCHLORIDE 5 MG PO TABS: 5.0000 mg | ORAL_TABLET | Freq: Every evening | ORAL | Status: DC

## 2019-07-05 MED ORDER — HYDROMORPHONE HCL 1 MG/ML IJ SOLN
1.0000 mg | INTRAMUSCULAR | Status: DC | PRN
  Administered 2019-07-05: 14:00:00 1 mg via INTRAVENOUS
  Filled 2019-07-05 (×2): qty 1

## 2019-07-05 MED ORDER — PANTOPRAZOLE SODIUM 40 MG IV SOLR
40.0000 mg | Freq: Once | INTRAVENOUS | Status: AC
  Administered 2019-07-05: 10:00:00 40 mg via INTRAVENOUS
  Filled 2019-07-05: qty 40

## 2019-07-05 MED ORDER — LORATADINE 10 MG PO TABS
10.0000 mg | ORAL_TABLET | Freq: Every day | ORAL | Status: DC
  Administered 2019-07-05 – 2019-07-06 (×2): 10 mg via ORAL
  Filled 2019-07-05 (×2): qty 1

## 2019-07-05 MED ORDER — MONTELUKAST SODIUM 10 MG PO TABS
10.0000 mg | ORAL_TABLET | Freq: Every day | ORAL | Status: DC
  Administered 2019-07-05: 10 mg via ORAL
  Filled 2019-07-05 (×2): qty 1

## 2019-07-05 MED ORDER — POTASSIUM CHLORIDE IN NACL 20-0.9 MEQ/L-% IV SOLN
INTRAVENOUS | Status: DC
  Filled 2019-07-05 (×5): qty 1000

## 2019-07-05 MED ORDER — IPRATROPIUM BROMIDE 0.06 % NA SOLN: 2.0000 | Freq: Three times a day (TID) | NASAL | Status: DC

## 2019-07-05 MED ORDER — PIPERACILLIN-TAZOBACTAM 3.375 G IVPB
3.3750 g | Freq: Three times a day (TID) | INTRAVENOUS | Status: DC
  Administered 2019-07-05 – 2019-07-06 (×4): 3.375 g via INTRAVENOUS
  Filled 2019-07-05 (×4): qty 50

## 2019-07-05 MED ORDER — ONDANSETRON HCL 4 MG/2ML IJ SOLN
4.0000 mg | Freq: Once | INTRAMUSCULAR | Status: AC
  Administered 2019-07-05: 10:00:00 4 mg via INTRAVENOUS
  Filled 2019-07-05: qty 2

## 2019-07-05 MED ORDER — ONDANSETRON 4 MG PO TBDP: 4.0000 mg | ORAL_TABLET | Freq: Four times a day (QID) | ORAL | Status: DC | PRN

## 2019-07-05 MED ORDER — PROMETHAZINE HCL 25 MG/ML IJ SOLN
12.5000 mg | Freq: Four times a day (QID) | INTRAMUSCULAR | Status: DC | PRN
  Administered 2019-07-05: 12.5 mg via INTRAVENOUS
  Filled 2019-07-05: qty 1

## 2019-07-05 MED ORDER — SODIUM CHLORIDE 0.9 % IV BOLUS
1000.0000 mL | Freq: Once | INTRAVENOUS | Status: AC
  Administered 2019-07-05: 10:00:00 1000 mL via INTRAVENOUS

## 2019-07-05 MED ORDER — ENOXAPARIN SODIUM 40 MG/0.4ML ~~LOC~~ SOLN
40.0000 mg | SUBCUTANEOUS | Status: DC
  Administered 2019-07-05 – 2019-07-07 (×2): 40 mg via SUBCUTANEOUS
  Filled 2019-07-05 (×3): qty 0.4

## 2019-07-05 NOTE — Plan of Care (Signed)
  Problem: Clinical Measurements: Goal: Cardiovascular complication will be avoided Outcome: Progressing   Problem: Clinical Measurements: Goal: Respiratory complications will improve Outcome: Progressing   Problem: Nutrition: Goal: Adequate nutrition will be maintained Outcome: Progressing   Problem: Elimination: Goal: Will not experience complications related to bowel motility Outcome: Progressing

## 2019-07-05 NOTE — ED Provider Notes (Signed)
[facility] DEPT Provider Note   CSN: [id number] Arrival date & time: 07/05/19  [id number]     History Chief Complaint  Patient presents with  . Abdominal Pain  . Emesis    Heather Fuller is a 64 y.o. female.  HPI      Heather Fuller is a 64 y.o. female, with a history of asthma, anemia, GERD, presenting to the ED with epigastric pain beginning last night around 10 PM.  Pain is sharp/stabbing, 8/10, constant, worse with standing or movement, nonradiating.  She has not had this pain before.  Accompanied by nausea and several episodes of nonbloody, nonbilious vomiting. She drinks 1 to 2 glasses of wine a night with last alcohol last night.  Denies use of NSAIDs, tobacco, or illicit drugs.  She ate dinner at a restaurant around 5:30 PM last night.  Her husband ate the same restaurant and has not had any symptoms.  Last bowel movement was yesterday and was normal. She has not been on omeprazole for quite some time, but took a dose last night without improvement. Denies fever/chills, diarrhea, hematochezia/melena, chest pain, shortness of breath, cough, urinary symptoms, syncope, neurologic deficits, or any other complaints.   Past Medical History:  Diagnosis Date  . Allergy   . Anemia   . Asthma   . Cancer (Heather Fuller)    melanoma  . Gallstones   . GERD (gastroesophageal reflux disease)   . Melanoma in situ of left shoulder ([place])   . Osteopenia     Patient Active Problem List   Diagnosis Date Noted  . Cholelithiasis with cholecystitis 07/05/2019  . MELANOMA OF SKIN, SITE UNSPECIFIED 01/27/2008  . ASTHMA 01/26/2008  . CONSTIPATION, CHRONIC 01/26/2008  . HEADACHE 01/26/2008    Past Surgical History:  Procedure Laterality Date  . ABDOMINAL HYSTERECTOMY    . MELANOMA EXCISION    . mylemectomy       OB History   No obstetric history on file.     Family History  Problem Relation Age of Onset  . Diabetes Mother   . Colon cancer Maternal Aunt   .  Colon polyps Neg Hx   . Esophageal cancer Neg Hx   . Pancreatic cancer Neg Hx   . Rectal cancer Neg Hx   . Stomach cancer Neg Hx     Social History   Tobacco Use  . Smoking status: Never Smoker  . Smokeless tobacco: Never Used  Substance Use Topics  . Alcohol use: Yes    Alcohol/week: 4.0 standard drinks    Types: 4 Cans of beer per week    Comment: one a day  . Drug use: No    Home Medications Prior to Admission medications   Medication Sig Start Date End Date Taking? Authorizing Provider  budesonide-formoterol (SYMBICORT) 160-4.5 MCG/ACT inhaler Inhale 2 puffs into the lungs as needed.   Yes [provider]  ipratropium (ATROVENT) 0.06 % nasal spray Place 2 sprays into both nostrils 3 (three) times daily.  09/02/17  Yes [provider]  levalbuterol (XOPENEX HFA) 45 MCG/ACT inhaler Inhale 1-2 puffs into the lungs as needed for wheezing.   Yes [provider]  levocetirizine (XYZAL) 5 MG tablet Take 5 mg by mouth every evening.   Yes [provider]  montelukast (SINGULAIR) 10 MG tablet Take 10 mg by mouth at bedtime.   Yes [provider]  diclofenac (VOLTAREN) 75 MG EC tablet Take 1 tablet (75 mg total) by mouth 2 (  two) times daily. Patient not taking: Reported on 12/03/2017 10/22/14   Wallene Huh, DPM    Allergies    Latex and Morphine  Review of Systems   Review of Systems  Constitutional: Negative for chills and fever.  Respiratory: Negative for cough and shortness of breath.   Cardiovascular: Negative for chest pain.  Gastrointestinal: Positive for abdominal pain, nausea and vomiting. Negative for blood in stool, constipation and diarrhea.  Genitourinary: Negative for dysuria, flank pain, frequency and hematuria.  Musculoskeletal: Negative for back pain.  Neurological: Negative for dizziness and syncope.  All other systems reviewed and are negative.   Physical Exam Updated Vital Signs BP 116/71 (BP Location: Left  Arm)   Pulse 77   Temp 98.8 F (37.1 C) (Oral)   Resp 18   Ht 5\' 6"  (1.676 m)   Wt 63.5 kg   SpO2 100%   BMI 22.60 kg/m   Physical Exam Vitals and nursing note reviewed.  Constitutional:      General: She is not in acute distress.    Appearance: She is well-developed. She is not diaphoretic.  HENT:     Head: Normocephalic and atraumatic.     Mouth/Throat:     Mouth: Mucous membranes are moist.     Pharynx: Oropharynx is clear.  Eyes:     Conjunctiva/sclera: Conjunctivae normal.  Cardiovascular:     Rate and Rhythm: Normal rate and regular rhythm.     Pulses: Normal pulses.          Radial pulses are 2+ on the right side and 2+ on the left side.       Posterior tibial pulses are 2+ on the right side and 2+ on the left side.     Heart sounds: Normal heart sounds.     Comments: Tactile temperature in the extremities appropriate and equal bilaterally. Pulmonary:     Effort: Pulmonary effort is normal. No respiratory distress.     Breath sounds: Normal breath sounds.  Abdominal:     Palpations: Abdomen is soft.     Tenderness: There is abdominal tenderness in the epigastric area. There is no guarding.       Comments: Patient is quite tender in the epigastric region.  When other areas of the abdomen are palpated, she endorses increased pain in the epigastric and upper abdominal regions.  Musculoskeletal:     Cervical back: Neck supple.     Right lower leg: No edema.     Left lower leg: No edema.  Lymphadenopathy:     Cervical: No cervical adenopathy.  Skin:    General: Skin is warm and dry.  Neurological:     Mental Status: She is alert.  Psychiatric:        Mood and Affect: Mood and affect normal.        Speech: Speech normal.        Behavior: Behavior normal.     ED Results / Procedures / Treatments   Labs (all labs ordered are listed, but only abnormal results are displayed) Labs Reviewed  COMPREHENSIVE METABOLIC PANEL - Abnormal; Notable for the following  components:      Result Value   Glucose, Bld 147 (*)    AST 296 (*)    ALT 293 (*)    Alkaline Phosphatase 130 (*)    Total Bilirubin 2.3 (*)    All other components within normal limits  LIPASE, BLOOD - Abnormal; Notable for the following components:   Lipase 1,824 (*)  All other components within normal limits  CBC WITH DIFFERENTIAL/PLATELET - Abnormal; Notable for the following components:   WBC 23.5 (*)    Neutro Abs 20.9 (*)    Monocytes Absolute 1.5 (*)    Abs Immature Granulocytes 0.20 (*)    All other components within normal limits  SARS CORONAVIRUS 2 (TAT 6-24 HRS)  URINALYSIS, ROUTINE W REFLEX MICROSCOPIC    EKG EKG Interpretation  Date/Time:  Sunday July 05 2019 09:24:44 EST Ventricular Rate:  76 PR Interval:    QRS Duration: 82 QT Interval:  312 QTC Calculation: 351 R Axis:   77 Text Interpretation: Sinus rhythm Low voltage, precordial leads Minimal ST depression, diffuse leads No significant change was found Confirmed by Daleen Bo [phone]) on 07/05/2019 9:34:35 AM   Radiology US Abdomen Limited RUQ  Result Date: 07/05/2019 CLINICAL DATA:  Right upper quadrant/epigastric abdominal pain, nausea and vomiting for 1 day. EXAM: ULTRASOUND ABDOMEN LIMITED RIGHT UPPER QUADRANT COMPARISON:  01/14/2013 CT abdomen/pelvis. FINDINGS: Gallbladder: Multiple layering shadowing gallstones in the gallbladder, largest 1.4 cm. No gallbladder wall thickening. No pericholecystic fluid. Sonographic Percell Miller sign is present. Common bile duct: Diameter: 7 mm Liver: Hyperechoic 1.4 x 1.2 x 1.4 cm posterior right liver lesion. No additional liver lesions. Background liver parenchymal echogenicity and echotexture are normal. Portal vein is patent on color Doppler imaging with normal direction of blood flow towards the liver. Other: None. IMPRESSION: 1. Cholelithiasis. Sonographic Murphy sign present. No gallbladder wall thickening or pericholecystic fluid. Findings are compatible with  acute cholecystitis in the correct clinical setting. 2. Mildly dilated common bile duct (7 mm diameter). Recommend correlation with serum bilirubin levels. Consider MRI abdomen with MRCP without and with IV contrast for further evaluation, to exclude choledocholithiasis. 3. Hyperechoic 1.4 cm indeterminate posterior right liver lesion, which should also be further evaluated at MRI. Electronically Signed   By: Ilona Sorrel M.D.   On: 07/05/2019 10:59    Procedures .Critical Care Performed by: Lorayne Bender, PA-C Authorized by: Lorayne Bender, PA-C   Critical care provider statement:    Critical care time (minutes):  35   Critical care time was exclusive of:  Separately billable procedures and treating other patients   Critical care was necessary to treat or prevent imminent or life-threatening deterioration of the following conditions: Cholecystitis requiring immediate surgical management.   Critical care was time spent personally by me on the following activities:  Discussions with consultants, development of treatment plan with patient or surrogate, ordering and performing treatments and interventions, ordering and review of laboratory studies, ordering and review of radiographic studies, re-evaluation of patient's condition, obtaining history from patient or surrogate, evaluation of patient's response to treatment and examination of patient   I assumed direction of critical care for this patient from another provider in my specialty: no     (including critical care time)  Medications Ordered in ED Medications  ondansetron (ZOFRAN) injection 4 mg (4 mg Intravenous Given 07/05/19 1018)  sodium chloride 0.9 % bolus 1,000 mL (0 mLs Intravenous Stopped 07/05/19 1108)  fentaNYL (SUBLIMAZE) injection 50 mcg (50 mcg Intravenous Given 07/05/19 1018)  pantoprazole (PROTONIX) injection 40 mg (40 mg Intravenous Given 07/05/19 1018)  famotidine (PEPCID) IVPB 20 mg premix (0 mg Intravenous Stopped 07/05/19 1108)   fentaNYL (SUBLIMAZE) injection 50 mcg (50 mcg Intravenous Given 07/05/19 1205)    ED Course  I have reviewed the triage vital signs and the nursing notes.  Pertinent labs & imaging results that were available  during my care of the patient were reviewed by me and considered in my medical decision making (see chart for details).  Clinical Course as of Jul 04 1298  Sun Jul 05, 2019  1042 Checked on the patient following her ultrasound.  She states her pain and nausea have improved.   [SJ]  1105 RUQ results noted. As patient is not toxic or septic, I plan to await CMP and lipase results prior to contacting general surgeon.   [SJ]  1130 Patient's pain has improved. Declines further analgesia at this time.   [SJ]  1143 Spoke with Dr. Ninfa Linden, general surgeon. We discussed the patient's symptoms, lab results, physical exam findings, and ultrasound results. He states he will admit the patient.  He is fine with the 6-24-hour Covid testing.  He will take care of admission orders, any antibiotics, and any further imaging.   [SJ]    Clinical Course User Index [SJ] Emric Kowalewski, Helane Gunther, PA-C   MDM Rules/Calculators/A&P                      Patient presents with abdominal pain, nausea, vomiting. Patient is nontoxic appearing, afebrile, not tachycardic, not tachypneic, not hypotensive, maintains excellent SPO2 on room air. She is quite tender on her abdominal exam and has a significant leukocytosis.  I have reviewed the patient's chart to obtain more information.  I noted the following: Colonoscopy performed by Dr. Loletha Carrow August 2019. I reviewed and interpreted the patient's labs and radiological studies.  Ultrasound with cholelithiasis and sonographic Murphy sign, giving concern for cholecystitis. Choledocholithiasis also possibility with dilated common bile duct, mildly elevated liver enzymes, and elevated lipase. Patient admitted via general surgery.  Findings and plan of care discussed with Daleen Bo, MD. Dr. Eulis Foster personally evaluated and examined this patient.  Vitals:   07/05/19 1130 07/05/19 1139 07/05/19 1200 07/05/19 1230  BP: (!) 111/56 (!) 111/56 119/67 112/62  Pulse: 72 72 71 74  Resp: 16 12 13 16   Temp:      TempSrc:      SpO2: 100% 100% 100% 100%  Weight:      Height:           Final Clinical Impression(s) / ED Diagnoses Final diagnoses:  Epigastric pain  Calculus of gallbladder and bile duct with acute cholecystitis, with obstruction    Rx / DC Orders ED Discharge Orders    None       Layla Maw 07/05/19 1301    Daleen Bo, MD 07/07/19 [phone]

## 2019-07-05 NOTE — H&P (Signed)
Heather Fuller is an 64 y.o. female.   Chief Complaint: Abdominal pain HPI: This is a 64 year old female who presents with epigastric and right upper quadrant abdominal pain.  This started around 10 PM last evening.  She also developed nausea and vomiting.  She describes the pain as sharp and stabbing and severe.  She presented to the emergency department.  She is found to have an elevated white blood count, elevated liver function test, and an ultrasound showing gallstones.  Her discomfort has decreased since earlier today.  She may have had other mild attacks of biliary colic in the past but this has been the most severe.  She is otherwise been doing well and without complaints.  Bowel movements are normal.  She denies fevers or chills.  Past Medical History:  Diagnosis Date  . Allergy   . Anemia   . Asthma   . Cancer ([place])    melanoma  . Gallstones   . GERD (gastroesophageal reflux disease)   . Melanoma in situ of left shoulder ([place])   . Osteopenia     Past Surgical History:  Procedure Laterality Date  . ABDOMINAL HYSTERECTOMY    . MELANOMA EXCISION    . mylemectomy      Family History  Problem Relation Age of Onset  . Diabetes Mother   . Colon cancer Maternal Aunt   . Colon polyps Neg Hx   . Esophageal cancer Neg Hx   . Pancreatic cancer Neg Hx   . Rectal cancer Neg Hx   . Stomach cancer Neg Hx    Social History:  reports that she has never smoked. She has never used smokeless tobacco. She reports current alcohol use of about 4.0 standard drinks of alcohol per week. She reports that she does not use drugs.  Allergies:  Allergies  Allergen Reactions  . Latex     Skin blisters  . Morphine Nausea And Vomiting    REACTION: severe nauseaa and vomiting    Medications Prior to Admission  Medication Sig Dispense Refill  . budesonide-formoterol (SYMBICORT) 160-4.5 MCG/ACT inhaler Inhale 2 puffs into the lungs as needed.    Marland Kitchen ipratropium (ATROVENT) 0.06 % nasal spray Place 2  sprays into both nostrils 3 (three) times daily.   3  . levalbuterol (XOPENEX HFA) 45 MCG/ACT inhaler Inhale 1-2 puffs into the lungs as needed for wheezing.    Marland Kitchen levocetirizine (XYZAL) 5 MG tablet Take 5 mg by mouth every evening.    . montelukast (SINGULAIR) 10 MG tablet Take 10 mg by mouth at bedtime.    . diclofenac (VOLTAREN) 75 MG EC tablet Take 1 tablet (75 mg total) by mouth 2 (two) times daily. (Patient not taking: Reported on 12/03/2017) 50 tablet 2    Results for orders placed or performed during the hospital encounter of 07/05/19 (from the past 48 hour(s))  Comprehensive metabolic panel     Status: Abnormal   Collection Time: 07/05/19 10:00 AM  Result Value Ref Range   Sodium 138 135 - 145 mmol/L   Potassium 3.5 3.5 - 5.1 mmol/L   Chloride 100 98 - 111 mmol/L   CO2 27 22 - 32 mmol/L   Glucose, Bld 147 (H) 70 - 99 mg/dL   BUN 14 8 - 23 mg/dL   Creatinine, Ser 0.86 0.44 - 1.00 mg/dL   Calcium 9.9 8.9 - 10.3 mg/dL   Total Protein 7.4 6.5 - 8.1 g/dL   Albumin 4.2 3.5 - 5.0 g/dL   AST  296 (H) 15 - 41 U/L   ALT 293 (H) 0 - 44 U/L   Alkaline Phosphatase 130 (H) 38 - 126 U/L   Total Bilirubin 2.3 (H) 0.3 - 1.2 mg/dL   GFR calc non Af Amer >60 >60 mL/min   GFR calc Af Amer >60 >60 mL/min   Anion gap 11 5 - 15    Comment: Performed at [facility], [place] [address] [place], [place] [address]  Lipase, blood     Status: Abnormal   Collection Time: 07/05/19 10:00 AM  Result Value Ref Range   Lipase 1,824 (H) 11 - 51 U/L    Comment: RESULTS CONFIRMED BY MANUAL DILUTION Performed at [facility], [place] [address] [place], [place] [address]   CBC with Differential     Status: Abnormal   Collection Time: 07/05/19 10:00 AM  Result Value Ref Range   WBC 23.5 (H) 4.0 - 10.5 K/uL   RBC 4.40 3.87 - 5.11 MIL/uL   Hemoglobin 13.9 12.0 - 15.0 g/dL   HCT 41.6 36.0 - 46.0 %   MCV 94.5 80.0 - 100.0 fL   MCH 31.6 26.0 - 34.0 pg   MCHC 33.4 30.0 - 36.0  g/dL   RDW 12.1 11.5 - 15.5 %   Platelets 240 150 - 400 K/uL   nRBC 0.0 0.0 - 0.2 %   Neutrophils Relative % 90 %   Neutro Abs 20.9 (H) 1.7 - 7.7 K/uL   Lymphocytes Relative 3 %   Lymphs Abs 0.7 0.7 - 4.0 K/uL   Monocytes Relative 6 %   Monocytes Absolute 1.5 (H) 0.1 - 1.0 K/uL   Eosinophils Relative 0 %   Eosinophils Absolute 0.1 0.0 - 0.5 K/uL   Basophils Relative 0 %   Basophils Absolute 0.0 0.0 - 0.1 K/uL   Immature Granulocytes 1 %   Abs Immature Granulocytes 0.20 (H) 0.00 - 0.07 K/uL    Comment: Performed at [facility], [place] [address], [place], [place] [address]  Urinalysis, Routine w reflex microscopic     Status: Abnormal   Collection Time: 07/05/19 12:35 PM  Result Value Ref Range   Color, Urine YELLOW YELLOW   APPearance CLEAR CLEAR   Specific Gravity, Urine 1.018 1.005 - 1.030   pH 9.0 (H) 5.0 - 8.0   Glucose, UA NEGATIVE NEGATIVE mg/dL   Hgb urine dipstick NEGATIVE NEGATIVE   Bilirubin Urine NEGATIVE NEGATIVE   Ketones, ur NEGATIVE NEGATIVE mg/dL   Protein, ur NEGATIVE NEGATIVE mg/dL   Nitrite NEGATIVE NEGATIVE   Leukocytes,Ua NEGATIVE NEGATIVE    Comment: Performed at [facility] [address] [place], [place] [address]   US Abdomen Limited RUQ  Result Date: 07/05/2019 CLINICAL DATA:  Right upper quadrant/epigastric abdominal pain, nausea and vomiting for 1 day. EXAM: ULTRASOUND ABDOMEN LIMITED RIGHT UPPER QUADRANT COMPARISON:  01/14/2013 CT abdomen/pelvis. FINDINGS: Gallbladder: Multiple layering shadowing gallstones in the gallbladder, largest 1.4 cm. No gallbladder wall thickening. No pericholecystic fluid. Sonographic Percell Miller sign is present. Common bile duct: Diameter: 7 mm Liver: Hyperechoic 1.4 x 1.2 x 1.4 cm posterior right liver lesion. No additional liver lesions. Background liver parenchymal echogenicity and echotexture are normal. Portal vein is patent on color Doppler imaging with normal direction of blood flow  towards the liver. Other: None. IMPRESSION: 1. Cholelithiasis. Sonographic Murphy sign present. No gallbladder wall thickening or pericholecystic fluid. Findings are compatible with acute cholecystitis in the correct clinical setting. 2. Mildly dilated common  bile duct (7 mm diameter). Recommend correlation with serum bilirubin levels. Consider MRI abdomen with MRCP without and with IV contrast for further evaluation, to exclude choledocholithiasis. 3. Hyperechoic 1.4 cm indeterminate posterior right liver lesion, which should also be further evaluated at MRI. Electronically Signed   By: Ilona Sorrel M.D.   On: 07/05/2019 10:59    Review of Systems  Constitutional: Negative for chills.  Respiratory: Negative for cough and shortness of breath.   Cardiovascular: Negative for chest pain.  Gastrointestinal: Positive for abdominal pain, nausea and vomiting.  Genitourinary: Negative for dysuria.  All other systems reviewed and are negative.   Blood pressure 112/65, pulse 71, temperature 98.3 F (36.8 C), temperature source Oral, resp. rate 18, height 5\' 6"  (1.676 m), weight 63.5 kg, SpO2 98 %. Physical Exam  Constitutional: She appears well-developed and well-nourished. No distress.  Comfortable in appearance and conversant  HENT:  Right Ear: External ear normal.  Left Ear: External ear normal.  Nose: Nose normal.  Mouth/Throat: Oropharynx is clear and moist. No oropharyngeal exudate.  Eyes: Pupils are equal, round, and reactive to light. Conjunctivae are normal. Right eye exhibits no discharge. Left eye exhibits no discharge. No scleral icterus.  Neck: No tracheal deviation present. No thyromegaly present.  Cardiovascular: Normal rate, regular rhythm and normal heart sounds.  There is no edema  Respiratory: Effort normal and breath sounds normal. No respiratory distress. She has no wheezes.  GI: Soft. She exhibits no distension and no mass. There is abdominal tenderness. There is guarding.  There is no rebound.  Mild tenderness in the epigastrium and right upper quadrant with minimal guarding.  There is no hepatomegaly.  There are no hernias  Musculoskeletal:        General: No tenderness, deformity or edema. Normal range of motion.     Cervical back: Normal range of motion and neck supple.  Lymphadenopathy:    She has no cervical adenopathy.  Neurological: She is alert.  Skin: Skin is warm and dry. No rash noted. She is not diaphoretic. No erythema.  Psychiatric: She has a normal mood and affect. Her behavior is normal. Judgment and thought content normal.     Assessment/Plan Cholecystitis with cholelithiasis.  I explained the diagnosis to the patient and her family.  I have reviewed her laboratory data and ultrasound.  I believe she does have cholecystitis.  Given the slight dilation of the bile duct and elevated bilirubin, she may have choledocholithiasis.  She also has a 1.4 cm right liver lesion.  Given the liver lesion and the possibility of choledocholithiasis, she needs an MRCP preoperatively.  If the MRCP does show a stone in the bile duct, we will consult gastroenterology for possible ERCP.  She is being admitted and placed on IV antibiotics.  Her Covid test is currently pending.  If the MRCP is negative and no further work-up is needed regarding the liver lesion, hopefully, we will be received with a laparoscopic cholecystectomy and cholangiogram tomorrow.  For now, she will remain n.p.o. for the MRI.  After discussion, she is in agreement with the plan.  Coralie Keens, MD 07/05/2019, 2:29 PM

## 2019-07-05 NOTE — Progress Notes (Signed)
Pt stable at time of arrival to the floor. Pt did report moderate pain. No nausea or vomiting on arrival. Pt assessment performed. Rn will continue to monitor.

## 2019-07-05 NOTE — ED Triage Notes (Signed)
Arrives C/C upper mid abd pain since yesterday, reports nausea and abd pain that radiates up to her chest this AM. Denies diarrhea, SOB.

## 2019-07-05 NOTE — Progress Notes (Signed)
Dr. Ninfa Linden notified of need for additional n/v medication. Phenergan ordered and order will be placed in emr.

## 2019-07-05 NOTE — ED Provider Notes (Signed)
  Face-to-face evaluation   History: She presents for evaluation of upper abdominal pain, epigastric region, with subsequent vomiting, during the night.  No recent similar problems.  She has gallstones that have never been removed.  She denies fever, chills, blood in emesis, weakness or dizziness.  Physical exam: Alert, elderly female.  She does not appear to be in distress.  Abdomen with mild midepigastric tenderness.  No right upper quadrant tenderness or rebound tenderness.  Medical screening examination/treatment/procedure(s) were conducted as a shared visit with non-physician practitioner(s) and myself.  I personally evaluated the patient during the encounter    Daleen Bo, MD 07/07/19 [phone]

## 2019-07-06 ENCOUNTER — Encounter (HOSPITAL_COMMUNITY)
Admission: EM | Disposition: A | Discharge status: Home / Self Care | Payer: Self-pay | Source: Home / Self Care | Attending: Emergency Medicine

## 2019-07-06 ENCOUNTER — Encounter (HOSPITAL_COMMUNITY): Payer: Self-pay | Admitting: Surgery

## 2019-07-06 ENCOUNTER — Observation Stay (HOSPITAL_COMMUNITY): Payer: Medicare PPO | Admitting: Registered Nurse

## 2019-07-06 ENCOUNTER — Observation Stay (HOSPITAL_COMMUNITY): Payer: Medicare PPO

## 2019-07-06 DIAGNOSIS — K8012 Calculus of gallbladder with acute and chronic cholecystitis without obstruction: Secondary | ICD-10-CM | POA: Diagnosis not present

## 2019-07-06 DIAGNOSIS — Z20822 Contact with and (suspected) exposure to covid-19: Secondary | ICD-10-CM | POA: Diagnosis not present

## 2019-07-06 DIAGNOSIS — J45909 Unspecified asthma, uncomplicated: Secondary | ICD-10-CM | POA: Diagnosis not present

## 2019-07-06 DIAGNOSIS — K769 Liver disease, unspecified: Secondary | ICD-10-CM | POA: Diagnosis not present

## 2019-07-06 HISTORY — PX: CHOLECYSTECTOMY: SHX55

## 2019-07-06 LAB — COMPREHENSIVE METABOLIC PANEL
ALT: 148 U/L — ABNORMAL HIGH (ref 0–44)
AST: 94 U/L — ABNORMAL HIGH (ref 15–41)
Albumin: 3.1 g/dL — ABNORMAL LOW (ref 3.5–5.0)
Alkaline Phosphatase: 84 U/L (ref 38–126)
Anion gap: 5 (ref 5–15)
BUN: 10 mg/dL (ref 8–23)
CO2: 25 mmol/L (ref 22–32)
Calcium: 8.8 mg/dL — ABNORMAL LOW (ref 8.9–10.3)
Chloride: 112 mmol/L — ABNORMAL HIGH (ref 98–111)
Creatinine, Ser: 0.74 mg/dL (ref 0.44–1.00)
GFR calc Af Amer: >60 mL/min (ref >60)
GFR calc non Af Amer: >60 mL/min (ref >60)
Glucose, Bld: 109 mg/dL — ABNORMAL HIGH (ref 70–99)
Potassium: 4.1 mmol/L (ref 3.5–5.1)
Sodium: 142 mmol/L (ref 135–145)
Total Bilirubin: 1.6 mg/dL — ABNORMAL HIGH (ref 0.3–1.2)
Total Protein: 5.5 g/dL — ABNORMAL LOW (ref 6.5–8.1)

## 2019-07-06 LAB — CBC
HCT: 34.6 % — ABNORMAL LOW (ref 36.0–46.0)
Hemoglobin: 11.4 g/dL — ABNORMAL LOW (ref 12.0–15.0)
MCH: 32.5 pg (ref 26.0–34.0)
MCHC: 32.9 g/dL (ref 30.0–36.0)
MCV: 98.6 fL (ref 80.0–100.0)
Platelets: 173 10*3/uL (ref 150–400)
RBC: 3.51 MIL/uL — ABNORMAL LOW (ref 3.87–5.11)
RDW: 12.5 % (ref 11.5–15.5)
WBC: 12.8 10*3/uL — ABNORMAL HIGH (ref 4.0–10.5)
nRBC: 0 % (ref 0.0–0.2)

## 2019-07-06 SURGERY — LAPAROSCOPIC CHOLECYSTECTOMY WITH INTRAOPERATIVE CHOLANGIOGRAM
Anesthesia: General | Site: Abdomen

## 2019-07-06 MED ORDER — ACETAMINOPHEN 500 MG PO TABS
1000.0000 mg | ORAL_TABLET | ORAL | Status: AC
  Administered 2019-07-06: 13:00:00 1000 mg via ORAL
  Filled 2019-07-06: qty 2

## 2019-07-06 MED ORDER — MIDAZOLAM HCL 2 MG/2ML IJ SOLN
INTRAMUSCULAR | Status: AC
  Filled 2019-07-06: qty 2

## 2019-07-06 MED ORDER — LIDOCAINE 2% (20 MG/ML) 5 ML SYRINGE
INTRAMUSCULAR | Status: AC
  Filled 2019-07-06: qty 5

## 2019-07-06 MED ORDER — GABAPENTIN 300 MG PO CAPS
300.0000 mg | ORAL_CAPSULE | ORAL | Status: AC
  Administered 2019-07-06: 300 mg via ORAL
  Filled 2019-07-06: qty 1

## 2019-07-06 MED ORDER — FENTANYL CITRATE (PF) 250 MCG/5ML IJ SOLN
INTRAMUSCULAR | Status: AC
  Filled 2019-07-06: qty 5

## 2019-07-06 MED ORDER — FENTANYL CITRATE (PF) 100 MCG/2ML IJ SOLN
INTRAMUSCULAR | Status: AC
  Filled 2019-07-06: qty 2

## 2019-07-06 MED ORDER — METHYLPREDNISOLONE SODIUM SUCC 125 MG IJ SOLR
125.0000 mg | Freq: Once | INTRAMUSCULAR | Status: AC
  Administered 2019-07-06: 125 mg via INTRAVENOUS

## 2019-07-06 MED ORDER — CELECOXIB 200 MG PO CAPS
200.0000 mg | ORAL_CAPSULE | ORAL | Status: AC
  Administered 2019-07-06: 200 mg via ORAL
  Filled 2019-07-06: qty 1

## 2019-07-06 MED ORDER — BUPIVACAINE HCL (PF) 0.25 % IJ SOLN
INTRAMUSCULAR | Status: DC | PRN
  Administered 2019-07-06: 20 mL

## 2019-07-06 MED ORDER — RACEPINEPHRINE HCL 2.25 % IN NEBU
INHALATION_SOLUTION | RESPIRATORY_TRACT | Status: AC
  Filled 2019-07-06: qty 0.5

## 2019-07-06 MED ORDER — ROCURONIUM BROMIDE 10 MG/ML (PF) SYRINGE
PREFILLED_SYRINGE | INTRAVENOUS | Status: DC | PRN
  Administered 2019-07-06: 50 mg via INTRAVENOUS

## 2019-07-06 MED ORDER — FENTANYL CITRATE (PF) 250 MCG/5ML IJ SOLN
INTRAMUSCULAR | Status: DC | PRN
  Administered 2019-07-06 (×2): 50 ug via INTRAVENOUS

## 2019-07-06 MED ORDER — TRAMADOL HCL 50 MG PO TABS
50.0000 mg | ORAL_TABLET | Freq: Four times a day (QID) | ORAL | Status: DC | PRN
  Filled 2019-07-06: qty 1

## 2019-07-06 MED ORDER — DEXAMETHASONE SODIUM PHOSPHATE 10 MG/ML IJ SOLN
INTRAMUSCULAR | Status: AC
  Filled 2019-07-06: qty 1

## 2019-07-06 MED ORDER — ONDANSETRON HCL 4 MG/2ML IJ SOLN
INTRAMUSCULAR | Status: DC | PRN
  Administered 2019-07-06: 4 mg via INTRAVENOUS

## 2019-07-06 MED ORDER — SUGAMMADEX SODIUM 200 MG/2ML IV SOLN
INTRAVENOUS | Status: DC | PRN
  Administered 2019-07-06: 130 mg via INTRAVENOUS

## 2019-07-06 MED ORDER — SUCCINYLCHOLINE CHLORIDE 200 MG/10ML IV SOSY
PREFILLED_SYRINGE | INTRAVENOUS | Status: DC | PRN
  Administered 2019-07-06: 140 mg via INTRAVENOUS

## 2019-07-06 MED ORDER — ROCURONIUM BROMIDE 10 MG/ML (PF) SYRINGE
PREFILLED_SYRINGE | INTRAVENOUS | Status: AC
  Filled 2019-07-06: qty 10

## 2019-07-06 MED ORDER — 0.9 % SODIUM CHLORIDE (POUR BTL) OPTIME
TOPICAL | Status: DC | PRN
  Administered 2019-07-06: 1000 mL

## 2019-07-06 MED ORDER — CHLORHEXIDINE GLUCONATE CLOTH 2 % EX PADS
6.0000 | MEDICATED_PAD | Freq: Once | CUTANEOUS | Status: AC
  Administered 2019-07-06: 6 via TOPICAL

## 2019-07-06 MED ORDER — MIDAZOLAM HCL 2 MG/2ML IJ SOLN
INTRAMUSCULAR | Status: DC | PRN
  Administered 2019-07-06: 2 mg via INTRAVENOUS

## 2019-07-06 MED ORDER — FENTANYL CITRATE (PF) 100 MCG/2ML IJ SOLN
25.0000 ug | INTRAMUSCULAR | Status: DC | PRN
  Administered 2019-07-06 (×2): 50 ug via INTRAVENOUS

## 2019-07-06 MED ORDER — HYDROCODONE-ACETAMINOPHEN 5-325 MG PO TABS: 1.0000 | ORAL_TABLET | ORAL | Status: DC | PRN

## 2019-07-06 MED ORDER — RACEPINEPHRINE HCL 2.25 % IN NEBU
0.5000 mL | INHALATION_SOLUTION | Freq: Once | RESPIRATORY_TRACT | Status: AC
  Administered 2019-07-06: 0.5 mL via RESPIRATORY_TRACT

## 2019-07-06 MED ORDER — GUAIFENESIN ER 600 MG PO TB12
600.0000 mg | ORAL_TABLET | Freq: Two times a day (BID) | ORAL | Status: DC
  Administered 2019-07-06 – 2019-07-07 (×2): 600 mg via ORAL
  Filled 2019-07-06 (×2): qty 1

## 2019-07-06 MED ORDER — ONDANSETRON HCL 4 MG/2ML IJ SOLN
INTRAMUSCULAR | Status: AC
  Filled 2019-07-06: qty 2

## 2019-07-06 MED ORDER — MIDAZOLAM HCL 2 MG/2ML IJ SOLN
0.5000 mg | Freq: Once | INTRAMUSCULAR | Status: AC
  Administered 2019-07-06: 17:00:00 0.5 mg via INTRAVENOUS

## 2019-07-06 MED ORDER — IOHEXOL 300 MG/ML  SOLN
INTRAMUSCULAR | Status: DC | PRN
  Administered 2019-07-06: 3 mL

## 2019-07-06 MED ORDER — BUPIVACAINE HCL 0.25 % IJ SOLN
INTRAMUSCULAR | Status: AC
  Filled 2019-07-06: qty 1

## 2019-07-06 MED ORDER — LIDOCAINE 2% (20 MG/ML) 5 ML SYRINGE
INTRAMUSCULAR | Status: DC | PRN
  Administered 2019-07-06: 40 mg via INTRAVENOUS

## 2019-07-06 MED ORDER — PHENOL 1.4 % MT LIQD
1.0000 | OROMUCOSAL | Status: DC | PRN
  Filled 2019-07-06: qty 177

## 2019-07-06 MED ORDER — GADOBUTROL 1 MMOL/ML IV SOLN
6.0000 mL | Freq: Once | INTRAVENOUS | Status: AC | PRN
  Administered 2019-07-06: 6 mL via INTRAVENOUS

## 2019-07-06 MED ORDER — PROPOFOL 10 MG/ML IV BOLUS
INTRAVENOUS | Status: AC
  Filled 2019-07-06: qty 20

## 2019-07-06 MED ORDER — METHYLPREDNISOLONE SODIUM SUCC 125 MG IJ SOLR
INTRAMUSCULAR | Status: AC
  Filled 2019-07-06: qty 2

## 2019-07-06 MED ORDER — ALBUTEROL SULFATE (2.5 MG/3ML) 0.083% IN NEBU
INHALATION_SOLUTION | RESPIRATORY_TRACT | Status: AC
  Filled 2019-07-06: qty 3

## 2019-07-06 MED ORDER — ALBUTEROL SULFATE (2.5 MG/3ML) 0.083% IN NEBU
2.5000 mg | INHALATION_SOLUTION | Freq: Four times a day (QID) | RESPIRATORY_TRACT | Status: DC | PRN
  Administered 2019-07-06: 2.5 mg via RESPIRATORY_TRACT

## 2019-07-06 MED ORDER — LACTATED RINGERS IV SOLN: INTRAVENOUS | Status: DC

## 2019-07-06 MED ORDER — CHLORHEXIDINE GLUCONATE CLOTH 2 % EX PADS: 6.0000 | MEDICATED_PAD | Freq: Once | CUTANEOUS | Status: DC

## 2019-07-06 MED ORDER — PROPOFOL 10 MG/ML IV BOLUS
INTRAVENOUS | Status: DC | PRN
  Administered 2019-07-06: 20 mg via INTRAVENOUS
  Administered 2019-07-06: 150 mg via INTRAVENOUS

## 2019-07-06 MED ORDER — DEXAMETHASONE SODIUM PHOSPHATE 10 MG/ML IJ SOLN
INTRAMUSCULAR | Status: DC | PRN
  Administered 2019-07-06: 8 mg via INTRAVENOUS

## 2019-07-06 MED ORDER — LACTATED RINGERS IR SOLN
Status: DC | PRN
  Administered 2019-07-06: 1000 mL

## 2019-07-06 SURGICAL SUPPLY — 33 items
APPLIER CLIP 5 13 M/L LIGAMAX5 (MISCELLANEOUS) ×3
CABLE HIGH FREQUENCY MONO STRZ (ELECTRODE) ×3 IMPLANT
CATH REDDICK CHOLANGI 4FR 50CM (CATHETERS) ×3 IMPLANT
CHLORAPREP W/TINT 26 (MISCELLANEOUS) ×3 IMPLANT
CLIP APPLIE 5 13 M/L LIGAMAX5 (MISCELLANEOUS) ×1 IMPLANT
COVER MAYO STAND STRL (DRAPES) ×3 IMPLANT
COVER WAND RF STERILE (DRAPES) IMPLANT
DECANTER SPIKE VIAL GLASS SM (MISCELLANEOUS) ×3 IMPLANT
DERMABOND ADVANCED (GAUZE/BANDAGES/DRESSINGS) ×2
DERMABOND ADVANCED .7 DNX12 (GAUZE/BANDAGES/DRESSINGS) ×1 IMPLANT
DRAPE C-ARM 42X120 X-RAY (DRAPES) ×3 IMPLANT
ELECT PENCIL ROCKER SW 15FT (MISCELLANEOUS) ×3 IMPLANT
ELECT REM PT RETURN 15FT ADLT (MISCELLANEOUS) ×3 IMPLANT
GLOVE BIO SURGEON STRL SZ7.5 (GLOVE) ×3 IMPLANT
GOWN STRL REUS W/TWL XL LVL3 (GOWN DISPOSABLE) ×9 IMPLANT
HEMOSTAT SURGICEL 4X8 (HEMOSTASIS) IMPLANT
IV CATH 14GX2 1/4 (CATHETERS) ×3 IMPLANT
KIT BASIN OR (CUSTOM PROCEDURE TRAY) ×3 IMPLANT
KIT TURNOVER KIT A (KITS) IMPLANT
L-HOOK LAP DISP 36CM (ELECTROSURGICAL) ×3
LHOOK LAP DISP 36CM (ELECTROSURGICAL) ×1 IMPLANT
PENCIL SMOKE EVACUATOR (MISCELLANEOUS) IMPLANT
POUCH SPECIMEN RETRIEVAL 10MM (ENDOMECHANICALS) ×3 IMPLANT
SCISSORS LAP 5X35 DISP (ENDOMECHANICALS) ×3 IMPLANT
SET IRRIG TUBING LAPAROSCOPIC (IRRIGATION / IRRIGATOR) ×3 IMPLANT
SET TUBE SMOKE EVAC HIGH FLOW (TUBING) ×3 IMPLANT
SLEEVE XCEL OPT CAN 5 100 (ENDOMECHANICALS) ×6 IMPLANT
SUT MNCRL AB 4-0 PS2 18 (SUTURE) ×3 IMPLANT
TOWEL OR 17X26 10 PK STRL BLUE (TOWEL DISPOSABLE) ×3 IMPLANT
TOWEL OR NON WOVEN STRL DISP B (DISPOSABLE) ×3 IMPLANT
TRAY LAPAROSCOPIC (CUSTOM PROCEDURE TRAY) ×3 IMPLANT
TROCAR BLADELESS OPT 5 100 (ENDOMECHANICALS) ×3 IMPLANT
TROCAR XCEL BLUNT TIP 100MML (ENDOMECHANICALS) ×3 IMPLANT

## 2019-07-06 NOTE — Transfer of Care (Signed)
Immediate Anesthesia Transfer of Care Note  Patient: Heather Fuller  Procedure(s) Performed: LAPAROSCOPIC CHOLECYSTECTOMY WITH  INTRAOPERATIVE CHOLANGIOGRAM (N/A Abdomen)  Patient Location: PACU  Anesthesia Type:General  Level of Consciousness: awake, alert  and oriented  Airway & Oxygen Therapy: Patient Spontanous Breathing and Patient connected to face mask oxygen  Post-op Assessment: Report given to RN, Post -op Vital signs reviewed and stable and Patient moving all extremities X 4  Post vital signs: Reviewed and stable  Last Vitals:  Vitals Value Taken Time  BP    Temp    Pulse 80 07/06/19 1533  Resp 17 07/06/19 1533  SpO2 99 % 07/06/19 1533  Vitals shown include unvalidated device data.  Last Pain:  Vitals:   07/06/19 1329  TempSrc:   PainSc: 1       Patients Stated Pain Goal: 0 ([id number])  Complications: No apparent anesthesia complications

## 2019-07-06 NOTE — Progress Notes (Signed)
[facility] Progress Note     Subjective: CC-  Feeling much better this morning. Only having mild upper abdominal discomfort. Denies n/v. LFTs trending down. MRCP done, results pending.  Objective: Vital signs in last 24 hours: Temp:  [98.2 F (36.8 C)-99 F (37.2 C)] 98.2 F (36.8 C) (02/22 0545) Pulse Rate:  [64-77] 64 (02/22 0545) Resp:  [12-20] 19 (02/22 0545) BP: (92-119)/(51-72) 92/61 (02/22 0545) SpO2:  [96 %-100 %] 100 % (02/22 0545) Weight:  [63.5 kg] 63.5 kg (02/21 0924) Last BM Date: 07/04/19  Intake/Output from previous day: 02/21 0701 - 02/22 0700 In: 3402.4 [P.O.:240; I.V.:2009.7; IV Piggyback:1152.8] Out: -  Intake/Output this shift: No intake/output data recorded.  PE: Gen:  Alert, NAD, pleasant HEENT: EOM's intact, pupils equal and round Card:  RRR Pulm:  CTAB, no W/R/R, rate and effort normal Abd: Soft, ND, +BS, no HSM, no hernia, mild RUQ/epigastric TTP without rebound or guarding Psych: A&Ox4  Skin: no rashes noted, warm and dry  Lab Results:  Recent Labs    07/05/19 1000 07/06/19 0413  WBC 23.5* 12.8*  HGB 13.9 11.4*  HCT 41.6 34.6*  PLT 240 173   BMET Recent Labs    07/05/19 1000 07/06/19 0413  NA 138 142  K 3.5 4.1  CL 100 112*  CO2 27 25  GLUCOSE 147* 109*  BUN 14 10  CREATININE 0.86 0.74  CALCIUM 9.9 8.8*   PT/INR No results for input(s): LABPROT, INR in the last 72 hours. CMP     Component Value Date/Time   NA 142 07/06/2019 0413   K 4.1 07/06/2019 0413   CL 112 (H) 07/06/2019 0413   CO2 25 07/06/2019 0413   GLUCOSE 109 (H) 07/06/2019 0413   BUN 10 07/06/2019 0413   CREATININE 0.74 07/06/2019 0413   CALCIUM 8.8 (L) 07/06/2019 0413   PROT 5.5 (L) 07/06/2019 0413   ALBUMIN 3.1 (L) 07/06/2019 0413   AST 94 (H) 07/06/2019 0413   ALT 148 (H) 07/06/2019 0413   ALKPHOS 84 07/06/2019 0413   BILITOT 1.6 (H) 07/06/2019 0413   GFRNONAA >60 07/06/2019 0413   GFRAA >60 07/06/2019 0413   Lipase     Component  Value Date/Time   LIPASE 1,824 (H) 07/05/2019 1000       Studies/Results: US Abdomen Limited RUQ  Result Date: 07/05/2019 CLINICAL DATA:  Right upper quadrant/epigastric abdominal pain, nausea and vomiting for 1 day. EXAM: ULTRASOUND ABDOMEN LIMITED RIGHT UPPER QUADRANT COMPARISON:  01/14/2013 CT abdomen/pelvis. FINDINGS: Gallbladder: Multiple layering shadowing gallstones in the gallbladder, largest 1.4 cm. No gallbladder wall thickening. No pericholecystic fluid. Sonographic Percell Miller sign is present. Common bile duct: Diameter: 7 mm Liver: Hyperechoic 1.4 x 1.2 x 1.4 cm posterior right liver lesion. No additional liver lesions. Background liver parenchymal echogenicity and echotexture are normal. Portal vein is patent on color Doppler imaging with normal direction of blood flow towards the liver. Other: None. IMPRESSION: 1. Cholelithiasis. Sonographic Murphy sign present. No gallbladder wall thickening or pericholecystic fluid. Findings are compatible with acute cholecystitis in the correct clinical setting. 2. Mildly dilated common bile duct (7 mm diameter). Recommend correlation with serum bilirubin levels. Consider MRI abdomen with MRCP without and with IV contrast for further evaluation, to exclude choledocholithiasis. 3. Hyperechoic 1.4 cm indeterminate posterior right liver lesion, which should also be further evaluated at MRI. Electronically Signed   By: Ilona Sorrel M.D.   On: 07/05/2019 10:59    Anti-infectives: Anti-infectives (From admission, onward)   Start  Dose/Rate Route Frequency Ordered Stop   07/05/19 1400  piperacillin-tazobactam (ZOSYN) IVPB 3.375 g     3.375 g 12.5 mL/hr over 240 Minutes Intravenous Every 8 hours 07/05/19 1318         Assessment/Plan Asthma  Acute cholecystitis Elevated LFTs Liver lesion - MRCP pending. Depending on results will plan for laparoscopic cholecystectomy +/- ERCP prior to surgery. Keep NPO and continue IV zosyn.  ID - zosyn  2/241>> FEN - IVF, NPO VTE - SCDs, lovenox Foley - none Follow up - TBD    LOS: 0 days    Wellington Hampshire, [facility] 07/06/2019, 7:53 AM Please see Amion for pager number during day hours 7:00am-4:30pm

## 2019-07-06 NOTE — Anesthesia Preprocedure Evaluation (Addendum)
Anesthesia Evaluation  Patient identified by MRN, date of birth, ID band Patient awake    Reviewed: Allergy & Precautions, NPO status , Patient's Chart, lab work & pertinent test results  Airway Mallampati: II  TM Distance: >3 FB     Dental   Pulmonary asthma ,    breath sounds clear to auscultation       Cardiovascular negative cardio ROS   Rhythm:Regular Rate:Normal     Neuro/Psych  Headaches,    GI/Hepatic Neg liver ROS, GERD  ,History noted. CG   Endo/Other  negative endocrine ROS  Renal/GU negative Renal ROS     Musculoskeletal   Abdominal   Peds  Hematology   Anesthesia Other Findings   Reproductive/Obstetrics                            Anesthesia Physical Anesthesia Plan  ASA: III  Anesthesia Plan: General   Post-op Pain Management:    Induction: Intravenous  PONV Risk Score and Plan: 3 and Ondansetron, Dexamethasone and Midazolam  Airway Management Planned: Oral ETT  Additional Equipment:   Intra-op Plan:   Post-operative Plan: Extubation in OR  Informed Consent: I have reviewed the patients History and Physical, chart, labs and discussed the procedure including the risks, benefits and alternatives for the proposed anesthesia with the patient or authorized representative who has indicated his/her understanding and acceptance.     Dental advisory given  Plan Discussed with: CRNA and Anesthesiologist  Anesthesia Plan Comments:         Anesthesia Quick Evaluation

## 2019-07-06 NOTE — ED Provider Notes (Signed)
The purpose of the attempts at contact below were to try to secure follow-up for the patient regarding the liver lesions incidentally found on the patient's imaging studies yesterday.  Since patient was having acute pain associated with acute symptomatic cholelithiasis with possible cholecystitis versus choledocholithiasis and was subsequently admitted, I was unsure as to whether or not patient would be able to remember the instructions for follow-up on incidental findings.  I also did not have a good way to include these instructions in written format through our system.  10:52 AM Spoke with receptionist for Dr. Jaquita Rector office [facility]), patient's listed PCP. She states patient has not been seen by their office in at least 5 years.  She will need to call in and reestablish care with another of their providers.  11:01 AM Sent a message to Dr. Loletha Carrow, [facility], who it looks as if patient has seen in the last two years, asking if he would be able to follow the patient for these incidental findings.   MR 3D Recon At Scanner  Result Date: 07/06/2019 CLINICAL DATA:  Right upper quadrant and epigastric abdominal pain. Nausea and vomiting. Jaundice. Indeterminate liver lesion on recent ultrasound. EXAM: MRI ABDOMEN WITHOUT AND WITH CONTRAST (INCLUDING MRCP) TECHNIQUE: Multiplanar multisequence MR imaging of the abdomen was performed both before and after the administration of intravenous contrast. Heavily T2-weighted images of the biliary and pancreatic ducts were obtained, and three-dimensional MRCP images were rendered by post processing. CONTRAST:  21mL GADAVIST GADOBUTROL 1 MMOL/ML IV SOLN COMPARISON:  Ultrasound on 07/05/2019 and CT on 01/14/2013 FINDINGS: Lower chest: No acute findings. Hepatobiliary: Image degradation by motion artifact noted. A 1.3 cm T2 hyperintense lesion is seen in segment 7 on image 13/17, which shows probable mild peripheral contrast enhancement, but has nonspecific  characteristics. A similar lesion is seen in segment 2 which also measures 13 mm on image 32/26. Tiny sub-cm cyst is also seen in the segment 2 and segment 8. No evidence of steatosis on chemical shift imaging. Multiple gallstones are seen. Mild diffuse gallbladder wall thickening and contrast enhancement, with mild pericholecystic edema, is consistent with acute cholecystitis. No evidence of biliary ductal dilatation, with common bile duct measuring 5 mm. No evidence of choledocholithiasis or biliary stricture. Pancreas: No mass or inflammatory changes. No evidence of pancreatic ductal dilatation or pancreas divisum. Spleen:  Within normal limits in size and appearance. Adrenals/Urinary Tract: No masses identified. No evidence of hydronephrosis. Stomach/Bowel: Visualized portion unremarkable. Vascular/Lymphatic: No pathologically enlarged lymph nodes identified. No abdominal aortic aneurysm. Other:  None. Musculoskeletal:  No suspicious bone lesions identified. IMPRESSION: 1. Findings consistent with acute cholecystitis. 2. No evidence of biliary ductal dilatation, choledocholithiasis, or pancreatic mass. 3. Image degradation by motion artifact noted. Two small indeterminate liver lesions, largest measuring 1.3 cm. Recommend followup by abdomen MRI without and with contrast in 6 months. This recommendation follows ACR consensus guidelines: Management of Incidental Pancreatic Cysts: A White Paper of the ACR Incidental Findings Committee. [place] [id number]. Electronically Signed   By: Marlaine Hind M.D.   On: 07/06/2019 08:17   MR ABDOMEN MRCP W WO CONTAST  Result Date: 07/06/2019 CLINICAL DATA:  Right upper quadrant and epigastric abdominal pain. Nausea and vomiting. Jaundice. Indeterminate liver lesion on recent ultrasound. EXAM: MRI ABDOMEN WITHOUT AND WITH CONTRAST (INCLUDING MRCP) TECHNIQUE: Multiplanar multisequence MR imaging of the abdomen was performed both before and after the  administration of intravenous contrast. Heavily T2-weighted images of the biliary and  pancreatic ducts were obtained, and three-dimensional MRCP images were rendered by post processing. CONTRAST:  50mL GADAVIST GADOBUTROL 1 MMOL/ML IV SOLN COMPARISON:  Ultrasound on 07/05/2019 and CT on 01/14/2013 FINDINGS: Lower chest: No acute findings. Hepatobiliary: Image degradation by motion artifact noted. A 1.3 cm T2 hyperintense lesion is seen in segment 7 on image 13/17, which shows probable mild peripheral contrast enhancement, but has nonspecific characteristics. A similar lesion is seen in segment 2 which also measures 13 mm on image 32/26. Tiny sub-cm cyst is also seen in the segment 2 and segment 8. No evidence of steatosis on chemical shift imaging. Multiple gallstones are seen. Mild diffuse gallbladder wall thickening and contrast enhancement, with mild pericholecystic edema, is consistent with acute cholecystitis. No evidence of biliary ductal dilatation, with common bile duct measuring 5 mm. No evidence of choledocholithiasis or biliary stricture. Pancreas: No mass or inflammatory changes. No evidence of pancreatic ductal dilatation or pancreas divisum. Spleen:  Within normal limits in size and appearance. Adrenals/Urinary Tract: No masses identified. No evidence of hydronephrosis. Stomach/Bowel: Visualized portion unremarkable. Vascular/Lymphatic: No pathologically enlarged lymph nodes identified. No abdominal aortic aneurysm. Other:  None. Musculoskeletal:  No suspicious bone lesions identified. IMPRESSION: 1. Findings consistent with acute cholecystitis. 2. No evidence of biliary ductal dilatation, choledocholithiasis, or pancreatic mass. 3. Image degradation by motion artifact noted. Two small indeterminate liver lesions, largest measuring 1.3 cm. Recommend followup by abdomen MRI without and with contrast in 6 months. This recommendation follows ACR consensus guidelines: Management of Incidental Pancreatic  Cysts: A White Paper of the ACR Incidental Findings Committee. [place] [id number]. Electronically Signed   By: Marlaine Hind M.D.   On: 07/06/2019 08:17   US Abdomen Limited RUQ  Result Date: 07/05/2019 CLINICAL DATA:  Right upper quadrant/epigastric abdominal pain, nausea and vomiting for 1 day. EXAM: ULTRASOUND ABDOMEN LIMITED RIGHT UPPER QUADRANT COMPARISON:  01/14/2013 CT abdomen/pelvis. FINDINGS: Gallbladder: Multiple layering shadowing gallstones in the gallbladder, largest 1.4 cm. No gallbladder wall thickening. No pericholecystic fluid. Sonographic Percell Miller sign is present. Common bile duct: Diameter: 7 mm Liver: Hyperechoic 1.4 x 1.2 x 1.4 cm posterior right liver lesion. No additional liver lesions. Background liver parenchymal echogenicity and echotexture are normal. Portal vein is patent on color Doppler imaging with normal direction of blood flow towards the liver. Other: None. IMPRESSION: 1. Cholelithiasis. Sonographic Murphy sign present. No gallbladder wall thickening or pericholecystic fluid. Findings are compatible with acute cholecystitis in the correct clinical setting. 2. Mildly dilated common bile duct (7 mm diameter). Recommend correlation with serum bilirubin levels. Consider MRI abdomen with MRCP without and with IV contrast for further evaluation, to exclude choledocholithiasis. 3. Hyperechoic 1.4 cm indeterminate posterior right liver lesion, which should also be further evaluated at MRI. Electronically Signed   By: Ilona Sorrel M.D.   On: 07/05/2019 10:59     Lorayne Bender, PA-C 07/06/19 1455    Daleen Bo, MD 07/07/19 [phone]

## 2019-07-06 NOTE — Progress Notes (Signed)
Pt requested Mucinex due to episode in PACU where she felt like she couldn't breath and was coughing up sputum.  Called MD, order given.

## 2019-07-06 NOTE — Anesthesia Postprocedure Evaluation (Signed)
Anesthesia Post Note  Patient: Thayer Jew  Procedure(s) Performed: LAPAROSCOPIC CHOLECYSTECTOMY WITH  INTRAOPERATIVE CHOLANGIOGRAM (N/A Abdomen)     Patient location during evaluation: PACU Anesthesia Type: General Level of consciousness: awake Pain management: pain level controlled Vital Signs Assessment: post-procedure vital signs reviewed and stable Respiratory status: spontaneous breathing Cardiovascular status: stable Postop Assessment: no apparent nausea or vomiting Anesthetic complications: no    Last Vitals:  Vitals:   07/06/19 1745 07/06/19 1824  BP: 132/67 127/65  Pulse: 70 75  Resp: (!) 8 16  Temp:  36.8 C  SpO2: 100% 99%    Last Pain:  Vitals:   07/06/19 1824  TempSrc: Oral  PainSc:                  Marybell Robards

## 2019-07-06 NOTE — Discharge Instructions (Signed)
[facility] ° °Please arrive at least 30 min before your appointment to complete your check in paperwork.  If you are unable to arrive 30 min prior to your appointment time we may have to cancel or reschedule you. °LAPAROSCOPIC SURGERY: POST OP INSTRUCTIONS °Always review your discharge instruction sheet given to you by the facility where your surgery was performed. °IF YOU HAVE DISABILITY OR FAMILY LEAVE FORMS, YOU MUST BRING THEM TO THE OFFICE FOR PROCESSING.   °DO NOT GIVE THEM TO YOUR DOCTOR. ° °PAIN CONTROL ° °1. First take acetaminophen (Tylenol) AND/or ibuprofen (Advil) to control your pain after surgery.  Follow directions on package.  Taking acetaminophen (Tylenol) and/or ibuprofen (Advil) regularly after surgery will help to control your pain and lower the amount of prescription pain medication you may need.  You should not take more than 4,000 mg (4 grams) of acetaminophen (Tylenol) in 24 hours.  You should not take ibuprofen (Advil), aleve, motrin, naprosyn or other NSAIDS if you have a history of stomach ulcers or chronic kidney disease.  °2. A prescription for pain medication may be given to you upon discharge.  Take your pain medication as prescribed, if you still have uncontrolled pain after taking acetaminophen (Tylenol) or ibuprofen (Advil). °3. Use ice packs to help control pain. °4. If you need a refill on your pain medication, please contact your pharmacy.  They will contact our office to request authorization. Prescriptions will not be filled after 5pm or on week-ends. ° °HOME MEDICATIONS °5. Take your usually prescribed medications unless otherwise directed. ° °DIET °6. You should follow a light diet the first few days after arrival home.  Be sure to include lots of fluids daily. Avoid fatty, fried foods.  ° °CONSTIPATION °7. It is common to experience some constipation after surgery and if you are taking pain medication.  Increasing fluid intake and taking a stool  softener (such as Colace) will usually help or prevent this problem from occurring.  A mild laxative (Milk of Magnesia or Miralax) should be taken according to package instructions if there are no bowel movements after 48 hours. ° °WOUND/INCISION CARE °8. Most patients will experience some swelling and bruising in the area of the incisions.  Ice packs will help.  Swelling and bruising can take several days to resolve.  °9. Unless discharge instructions indicate otherwise, follow guidelines below  °a. STERI-STRIPS - you may remove your outer bandages 48 hours after surgery, and you may shower at that time.  You have steri-strips (small skin tapes) in place directly over the incision.  These strips should be left on the skin for 7-10 days.   °b. DERMABOND/SKIN GLUE - you may shower in 24 hours.  The glue will flake off over the next 2-3 weeks. °10. Any sutures or staples will be removed at the office during your follow-up visit. ° °ACTIVITIES °11. You may resume regular (light) daily activities beginning the next day--such as daily self-care, walking, climbing stairs--gradually increasing activities as tolerated.  You may have sexual intercourse when it is comfortable.  Refrain from any heavy lifting or straining until approved by your doctor. °a. You may drive when you are no longer taking prescription pain medication, you can comfortably wear a seatbelt, and you can safely maneuver your car and apply brakes. ° °FOLLOW-UP °12. You should see your doctor in the office for a follow-up appointment approximately 2-3 weeks after your surgery.  You should have been given your post-op/follow-up appointment when   your surgery was scheduled.  If you did not receive a post-op/follow-up appointment, make sure that you call for this appointment within a day or two after you arrive home to insure a convenient appointment time. ° °OTHER INSTRUCTIONS ° °WHEN TO CALL YOUR DOCTOR: °1. Fever over 101.0 °2. Inability to  urinate °3. Continued bleeding from incision. °4. Increased pain, redness, or drainage from the incision. °5. Increasing abdominal pain ° °The clinic staff is available to answer your questions during regular business hours.  Please don’t hesitate to call and ask to speak to one of the nurses for clinical concerns.  If you have a medical emergency, go to the nearest emergency room or call 911.  A surgeon from [facility] is always on call at the hospital. °[address], [address], [place], [place]  [address] ? [address] [address], , [place]   [address] °[phone] ? [phone] ? FAX [phone] ° ° ° °

## 2019-07-06 NOTE — Anesthesia Procedure Notes (Addendum)
Procedure Name: Intubation Date/Time: 07/06/2019 2:20 PM Performed by: Niel Hummer, CRNA Pre-anesthesia Checklist: Patient identified, Emergency Drugs available, Patient being monitored and Suction available Patient Re-evaluated:Patient Re-evaluated prior to induction Oxygen Delivery Method: Circle system utilized Preoxygenation: Pre-oxygenation with 100% oxygen Induction Type: IV induction Ventilation: Mask ventilation without difficulty Laryngoscope Size: Glidescope and 3 Grade View: Grade I Tube type: Oral Tube size: 7.0 mm Number of attempts: 2 Airway Equipment and Method: Stylet and Video-laryngoscopy Placement Confirmation: ETT inserted through vocal cords under direct vision,  positive ETCO2 and breath sounds checked- equal and bilateral Secured at: 23 cm Tube secured with: Tape Dental Injury: Injury to lip  Comments: MAC 4 DL x1 grade 2b view. Tube placed in esophagus. Mask ventilated the pt. Attempt by Dr. Nyoka Cowden DL x2 Sabra Heck 2, grade 3 view. Mask ventilated pt. Gildescope 3 used, grade 1 view. Tube placed successfully. Anterior larynx. Small lip laceration, pressure and lubrication applied.

## 2019-07-06 NOTE — Op Note (Signed)
07/05/2019 - 07/06/2019  3:15 PM  PATIENT:  Heather Fuller  64 y.o. female  PRE-OPERATIVE DIAGNOSIS:  acute cholecystitis with cholelithiasis  POST-OPERATIVE DIAGNOSIS:  acute cholecystitis with cholelithiasis  PROCEDURE:  Procedure(s): LAPAROSCOPIC CHOLECYSTECTOMY WITH  INTRAOPERATIVE CHOLANGIOGRAM (N/A)  SURGEON:  Surgeon(s) and Role:    * Jovita Kussmaul, MD - Primary  PHYSICIAN ASSISTANT:   ASSISTANTS: Saverio Danker, PA   ANESTHESIA:   local and general  EBL:  minimal   BLOOD ADMINISTERED:none  DRAINS: none   LOCAL MEDICATIONS USED:  MARCAINE     SPECIMEN:  Source of Specimen:  gallbladder  DISPOSITION OF SPECIMEN:  PATHOLOGY  COUNTS:  YES  TOURNIQUET:  * No tourniquets in log *  DICTATION: .Dragon Dictation     Procedure: After informed consent was obtained the patient was brought to the operating room and placed in the supine position on the operating room table. After adequate induction of general anesthesia the patient's abdomen was prepped with ChloraPrep allowed to dry and draped in usual sterile manner. An appropriate timeout was performed. The area below the umbilicus was infiltrated with quarter percent  Marcaine. A small incision was made with a 15 blade knife. The incision was carried down through the subcutaneous tissue bluntly with a hemostat and [employer] retractors. The linea alba was identified. The linea alba was incised with a 15 blade knife and each side was grasped with Coker clamps. The preperitoneal space was then probed with a hemostat until the peritoneum was opened and access was gained to the abdominal cavity. A 0 Vicryl pursestring stitch was placed in the fascia surrounding the opening. A Hassan cannula was then placed through the opening and anchored in place with the previously placed Vicryl purse string stitch. The abdomen was insufflated with carbon dioxide without difficulty. A laparoscope was inserted through the [facility] cannula in the right  upper quadrant was inspected. Next the epigastric region was infiltrated with % Marcaine. A small incision was made with a 15 blade knife. A 5 mm port was placed bluntly through this incision into the abdominal cavity under direct vision. Next 2 sites were chosen laterally on the right side of the abdomen for placement of 5 mm ports. Each of these areas was infiltrated with quarter percent Marcaine. Small stab incisions were made with a 15 blade knife. 5 mm ports were then placed bluntly through these incisions into the abdominal cavity under direct vision without difficulty. A blunt grasper was placed through the lateralmost 5 mm port and used to grasp the dome of the gallbladder and elevated anteriorly and superiorly. Another blunt grasper was placed through the other 5 mm port and used to retract the body and neck of the gallbladder. A dissector was placed through the epigastric port and using the electrocautery the peritoneal reflection at the gallbladder neck was opened. Blunt dissection was then carried out in this area until the gallbladder neck-cystic duct junction was readily identified and a good window was created. A single clip was placed on the gallbladder neck. A small  ductotomy was made just below the clip with laparoscopic scissors. A 14-gauge Angiocath was then placed through the anterior abdominal wall under direct vision. A Reddick cholangiogram catheter was then placed through the Angiocath and flushed. The catheter was then placed in the cystic duct and anchored in place with a clip. A cholangiogram was obtained that showed no filling defects good emptying into the duodenum an adequate length on the cystic duct. The anchoring clip  and catheters were then removed from the patient. 3 clips were placed proximally on the cystic duct and the duct was divided between the 2 sets of clips. Posterior to this the cystic artery was identified and again dissected bluntly in a circumferential manner until  a good window  was created. 2 clips were placed proximally and one distally on the artery and the artery was divided between the 2 sets of clips. Next a laparoscopic hook cautery device was used to separate the gallbladder from the liver bed. Prior to completely detaching the gallbladder from the liver bed the liver bed was inspected and several small bleeding points were coagulated with the electrocautery until the area was completely hemostatic. The gallbladder was then detached the rest of it from the liver bed without difficulty. A laparoscopic bag was inserted through the hassan port. The laparoscope was moved to the epigastric port. The gallbladder was placed within the bag and the bag was sealed.  The bag with the gallbladder was then removed with the [facility] cannula through the infraumbilical port without difficulty. The fascial defect was then closed with the previously placed Vicryl pursestring stitch as well as with another figure-of-eight 0 Vicryl stitch. The liver bed was inspected again and found to be hemostatic. The abdomen was irrigated with copious amounts of saline until the effluent was clear. The ports were then removed under direct vision without difficulty and were found to be hemostatic. The gas was allowed to escape. The skin incisions were all closed with interrupted 4-0 Monocryl subcuticular stitches. Dermabond dressings were applied. The patient tolerated the procedure well. At the end of the case all needle sponge and instrument counts were correct. The patient was then awakened and taken to recovery in stable condition  PLAN OF CARE: Admit for overnight observation  PATIENT DISPOSITION:  PACU - hemodynamically stable.   Delay start of Pharmacological VTE agent (>24hrs) due to surgical blood loss or risk of bleeding: no

## 2019-07-07 ENCOUNTER — Observation Stay (HOSPITAL_COMMUNITY): Payer: Medicare PPO

## 2019-07-07 LAB — SURGICAL PATHOLOGY

## 2019-07-07 LAB — HIV ANTIBODY (ROUTINE TESTING W REFLEX): HIV Screen 4th Generation wRfx: NONREACTIVE — AB

## 2019-07-07 MED ORDER — GUAIFENESIN ER 600 MG PO TB12: 600.0000 mg | ORAL_TABLET | Freq: Two times a day (BID) | ORAL | Status: DC | PRN

## 2019-07-07 MED ORDER — HYDROCODONE-ACETAMINOPHEN 5-325 MG PO TABS: 1.0000 | ORAL_TABLET | Freq: Four times a day (QID) | ORAL | 0 refills | Status: DC | PRN

## 2019-07-07 NOTE — Progress Notes (Signed)
D/C instructions given to patient. Patient had no questions. NT or writer will wheel patient out once family comes in  

## 2019-07-07 NOTE — Plan of Care (Signed)
  Problem: Education: Goal: Knowledge of General Education information will improve Description: Including pain rating scale, medication(s)/side effects and non-pharmacologic comfort measures Outcome: Progressing   Problem: Health Behavior/Discharge Planning: Goal: Ability to manage health-related needs will improve Outcome: Progressing   Problem: Clinical Measurements: Goal: Ability to maintain clinical measurements within normal limits will improve Outcome: Progressing   Problem: Activity: Goal: Risk for activity intolerance will decrease Outcome: Progressing   Problem: Coping: Goal: Level of anxiety will decrease Outcome: Progressing   Problem: Elimination: Goal: Will not experience complications related to bowel motility Outcome: Progressing   Problem: Pain Managment: Goal: General experience of comfort will improve Outcome: Progressing   Problem: Safety: Goal: Ability to remain free from injury will improve Outcome: Progressing   Problem: Skin Integrity: Goal: Risk for impaired skin integrity will decrease Outcome: Progressing   

## 2019-07-07 NOTE — Discharge Summary (Signed)
[facility] Discharge Summary   Patient ID: Heather Fuller MRN: [id number] DOB/AGE: 65-Jul-1957 65 y.o.  Admit date: 07/05/2019 Discharge date: 07/07/2019  Admitting Diagnosis: Acute cholecystitis  Discharge Diagnosis Patient Active Problem List   Diagnosis Date Noted  . Cholelithiasis with cholecystitis 07/05/2019  . MELANOMA OF SKIN, SITE UNSPECIFIED 01/27/2008  . ASTHMA 01/26/2008  . CONSTIPATION, CHRONIC 01/26/2008  . HEADACHE 01/26/2008    Consultants None  Imaging: DG Cholangiogram Operative  Result Date: 07/06/2019 CLINICAL DATA:  Cholecystectomy for cholelithiasis and cholecystitis. EXAM: INTRAOPERATIVE CHOLANGIOGRAM TECHNIQUE: Cholangiographic images from the C-arm fluoroscopic device were submitted for interpretation post-operatively. Please see the procedural report for the amount of contrast and the fluoroscopy time utilized. COMPARISON:  MRI on 07/06/2019 FINDINGS: Intraoperative imaging with a C-arm demonstrates mild prominence in caliber of the common bile duct without evidence of significant biliary obstruction or filling defect. Contrast is seen entering the duodenum. No contrast extravasation. IMPRESSION: Mildly prominent common bile duct caliber without evidence of filling defect or biliary obstruction. Electronically Signed   By: Aletta Edouard M.D.   On: 07/06/2019 15:37   MR 3D Recon At Scanner  Result Date: 07/06/2019 CLINICAL DATA:  Right upper quadrant and epigastric abdominal pain. Nausea and vomiting. Jaundice. Indeterminate liver lesion on recent ultrasound. EXAM: MRI ABDOMEN WITHOUT AND WITH CONTRAST (INCLUDING MRCP) TECHNIQUE: Multiplanar multisequence MR imaging of the abdomen was performed both before and after the administration of intravenous contrast. Heavily T2-weighted images of the biliary and pancreatic ducts were obtained, and three-dimensional MRCP images were rendered by post processing. CONTRAST:  83mL GADAVIST GADOBUTROL 1 MMOL/ML IV  SOLN COMPARISON:  Ultrasound on 07/05/2019 and CT on 01/14/2013 FINDINGS: Lower chest: No acute findings. Hepatobiliary: Image degradation by motion artifact noted. A 1.3 cm T2 hyperintense lesion is seen in segment 7 on image 13/17, which shows probable mild peripheral contrast enhancement, but has nonspecific characteristics. A similar lesion is seen in segment 2 which also measures 13 mm on image 32/26. Tiny sub-cm cyst is also seen in the segment 2 and segment 8. No evidence of steatosis on chemical shift imaging. Multiple gallstones are seen. Mild diffuse gallbladder wall thickening and contrast enhancement, with mild pericholecystic edema, is consistent with acute cholecystitis. No evidence of biliary ductal dilatation, with common bile duct measuring 5 mm. No evidence of choledocholithiasis or biliary stricture. Pancreas: No mass or inflammatory changes. No evidence of pancreatic ductal dilatation or pancreas divisum. Spleen:  Within normal limits in size and appearance. Adrenals/Urinary Tract: No masses identified. No evidence of hydronephrosis. Stomach/Bowel: Visualized portion unremarkable. Vascular/Lymphatic: No pathologically enlarged lymph nodes identified. No abdominal aortic aneurysm. Other:  None. Musculoskeletal:  No suspicious bone lesions identified. IMPRESSION: 1. Findings consistent with acute cholecystitis. 2. No evidence of biliary ductal dilatation, choledocholithiasis, or pancreatic mass. 3. Image degradation by motion artifact noted. Two small indeterminate liver lesions, largest measuring 1.3 cm. Recommend followup by abdomen MRI without and with contrast in 6 months. This recommendation follows ACR consensus guidelines: Management of Incidental Pancreatic Cysts: A White Paper of the ACR Incidental Findings Committee. [place] [id number]. Electronically Signed   By: Marlaine Hind M.D.   On: 07/06/2019 08:17   DG Abd Portable 1V  Result Date: 07/07/2019 CLINICAL DATA:  Small  bowel obstruction EXAM: PORTABLE ABDOMEN - 1 VIEW COMPARISON:  01/14/2013 CT FINDINGS: Normal bowel gas pattern. No abnormal stool retention. Cholecystectomy clips. Streaky density at the lung bases. No concerning mass effect or gas collection. Degenerative spurring  at the lower lumbar facets. IMPRESSION: 1.  Normal bowel gas pattern. 2. Incomplete coverage of the lung bases with streaky opacity, suggest chest x-ray. Electronically Signed   By: Monte Fantasia M.D.   On: 07/07/2019 06:04   MR ABDOMEN MRCP W WO CONTAST  Result Date: 07/06/2019 CLINICAL DATA:  Right upper quadrant and epigastric abdominal pain. Nausea and vomiting. Jaundice. Indeterminate liver lesion on recent ultrasound. EXAM: MRI ABDOMEN WITHOUT AND WITH CONTRAST (INCLUDING MRCP) TECHNIQUE: Multiplanar multisequence MR imaging of the abdomen was performed both before and after the administration of intravenous contrast. Heavily T2-weighted images of the biliary and pancreatic ducts were obtained, and three-dimensional MRCP images were rendered by post processing. CONTRAST:  21mL GADAVIST GADOBUTROL 1 MMOL/ML IV SOLN COMPARISON:  Ultrasound on 07/05/2019 and CT on 01/14/2013 FINDINGS: Lower chest: No acute findings. Hepatobiliary: Image degradation by motion artifact noted. A 1.3 cm T2 hyperintense lesion is seen in segment 7 on image 13/17, which shows probable mild peripheral contrast enhancement, but has nonspecific characteristics. A similar lesion is seen in segment 2 which also measures 13 mm on image 32/26. Tiny sub-cm cyst is also seen in the segment 2 and segment 8. No evidence of steatosis on chemical shift imaging. Multiple gallstones are seen. Mild diffuse gallbladder wall thickening and contrast enhancement, with mild pericholecystic edema, is consistent with acute cholecystitis. No evidence of biliary ductal dilatation, with common bile duct measuring 5 mm. No evidence of choledocholithiasis or biliary stricture. Pancreas: No mass  or inflammatory changes. No evidence of pancreatic ductal dilatation or pancreas divisum. Spleen:  Within normal limits in size and appearance. Adrenals/Urinary Tract: No masses identified. No evidence of hydronephrosis. Stomach/Bowel: Visualized portion unremarkable. Vascular/Lymphatic: No pathologically enlarged lymph nodes identified. No abdominal aortic aneurysm. Other:  None. Musculoskeletal:  No suspicious bone lesions identified. IMPRESSION: 1. Findings consistent with acute cholecystitis. 2. No evidence of biliary ductal dilatation, choledocholithiasis, or pancreatic mass. 3. Image degradation by motion artifact noted. Two small indeterminate liver lesions, largest measuring 1.3 cm. Recommend followup by abdomen MRI without and with contrast in 6 months. This recommendation follows ACR consensus guidelines: Management of Incidental Pancreatic Cysts: A White Paper of the ACR Incidental Findings Committee. [place] [id number]. Electronically Signed   By: Marlaine Hind M.D.   On: 07/06/2019 08:17   US Abdomen Limited RUQ  Result Date: 07/05/2019 CLINICAL DATA:  Right upper quadrant/epigastric abdominal pain, nausea and vomiting for 1 day. EXAM: ULTRASOUND ABDOMEN LIMITED RIGHT UPPER QUADRANT COMPARISON:  01/14/2013 CT abdomen/pelvis. FINDINGS: Gallbladder: Multiple layering shadowing gallstones in the gallbladder, largest 1.4 cm. No gallbladder wall thickening. No pericholecystic fluid. Sonographic Percell Miller sign is present. Common bile duct: Diameter: 7 mm Liver: Hyperechoic 1.4 x 1.2 x 1.4 cm posterior right liver lesion. No additional liver lesions. Background liver parenchymal echogenicity and echotexture are normal. Portal vein is patent on color Doppler imaging with normal direction of blood flow towards the liver. Other: None. IMPRESSION: 1. Cholelithiasis. Sonographic Murphy sign present. No gallbladder wall thickening or pericholecystic fluid. Findings are compatible with acute  cholecystitis in the correct clinical setting. 2. Mildly dilated common bile duct (7 mm diameter). Recommend correlation with serum bilirubin levels. Consider MRI abdomen with MRCP without and with IV contrast for further evaluation, to exclude choledocholithiasis. 3. Hyperechoic 1.4 cm indeterminate posterior right liver lesion, which should also be further evaluated at MRI. Electronically Signed   By: Ilona Sorrel M.D.   On: 07/05/2019 10:59    Procedures Dr. Marlou Starks (  07/06/2019) - Laparoscopic Cholecystectomy with [facility]  Hospital Course:  Heather Fuller is a 64yo female PMH asthma who presented to [facility] 2/21 with acute onset epigastric and RUQ abdominal pain. She was found to have an elevated white blood count, elevated liver function test, and an ultrasound showing gallstones.  Patient was admitted to the surgical service. MRCP performed and negative for choledocholithiasis. Patient was taken to the OR 2/22 for the procedure listed above. IOC also negative for common bile duct obstruction. Tolerated procedure well and was transferred to the floor.  Diet was advanced as tolerated.  On POD1 the patient was voiding well, tolerating diet, ambulating well, pain well controlled, vital signs stable, incisions c/d/i and felt stable for discharge home.  Patient will follow up as below and knows to call with questions or concerns.    I have personally reviewed the patients medication history on the [place] controlled substance database.    Physical Exam: General:  Alert, NAD, pleasant, comfortable Pulm: rate and effort normal Abd:  Soft, ND, appropriately tender, multiple lap incisions C/D/I  Allergies as of 07/07/2019      Reactions   Latex    Skin blisters   Morphine Nausea And Vomiting   REACTION: severe nauseaa and vomiting      Medication List    STOP taking these medications   diclofenac 75 MG EC tablet Commonly known as: VOLTAREN     TAKE these medications   guaiFENesin 600 MG 12 hr  tablet Commonly known as: MUCINEX Take 1 tablet (600 mg total) by mouth 2 (two) times daily as needed for to loosen phlegm.   HYDROcodone-acetaminophen 5-325 MG tablet Commonly known as: NORCO/VICODIN Take 1 tablet by mouth every 6 (six) hours as needed for severe pain.   ipratropium 0.06 % nasal spray Commonly known as: ATROVENT Place 2 sprays into both nostrils 3 (three) times daily.   levalbuterol 45 MCG/ACT inhaler Commonly known as: XOPENEX HFA Inhale 1-2 puffs into the lungs as needed for wheezing.   montelukast 10 MG tablet Commonly known as: SINGULAIR Take 10 mg by mouth at bedtime.   Symbicort 160-4.5 MCG/ACT inhaler Generic drug: budesonide-formoterol Inhale 2 puffs into the lungs as needed.   Xyzal 5 MG tablet Generic drug: levocetirizine Take 5 mg by mouth every evening.        Follow-up [facility], [place]. Call on 07/28/2019.   Specialty: General Surgery Why: Your appointment is 03/16 at 9 AM A provider will call you at the time of your appointment. Please send photo of incisions to [email or web address] prior to your appointment Contact information: [address] [place] Roth[place], Kevin, MD. Call.   Specialty: Family Medicine Why: Follow up with your primary care physician regarding liver lesions. You will need a repeat MRI in about 6 months Contact information: [place] [id number] [phone]           Signed: Wellington [place], [facility] 07/07/2019, 7:45 AM Please see Amion for pager number during day hours 7:00am-4:30pm

## 2019-08-26 ENCOUNTER — Ambulatory Visit
Admission: RE | Admit: 2019-08-26 | Discharge: 2019-08-26 | Disposition: A | Discharge status: Home / Self Care | Payer: Medicare PPO | Source: Ambulatory Visit | Attending: Family Medicine | Admitting: Family Medicine

## 2019-08-26 ENCOUNTER — Other Ambulatory Visit: Payer: Self-pay | Admitting: Family Medicine

## 2019-08-26 DIAGNOSIS — R9389 Abnormal findings on diagnostic imaging of other specified body structures: Secondary | ICD-10-CM

## 2019-10-14 DIAGNOSIS — J3089 Other allergic rhinitis: Secondary | ICD-10-CM | POA: Diagnosis not present

## 2019-10-14 DIAGNOSIS — J301 Allergic rhinitis due to pollen: Secondary | ICD-10-CM | POA: Diagnosis not present

## 2019-10-14 DIAGNOSIS — J3081 Allergic rhinitis due to animal (cat) (dog) hair and dander: Secondary | ICD-10-CM | POA: Diagnosis not present

## 2019-10-19 DIAGNOSIS — J301 Allergic rhinitis due to pollen: Secondary | ICD-10-CM | POA: Diagnosis not present

## 2019-10-19 DIAGNOSIS — J3081 Allergic rhinitis due to animal (cat) (dog) hair and dander: Secondary | ICD-10-CM | POA: Diagnosis not present

## 2019-10-19 DIAGNOSIS — J3089 Other allergic rhinitis: Secondary | ICD-10-CM | POA: Diagnosis not present

## 2019-10-27 DIAGNOSIS — J301 Allergic rhinitis due to pollen: Secondary | ICD-10-CM | POA: Diagnosis not present

## 2019-10-27 DIAGNOSIS — J3089 Other allergic rhinitis: Secondary | ICD-10-CM | POA: Diagnosis not present

## 2019-11-03 DIAGNOSIS — J3081 Allergic rhinitis due to animal (cat) (dog) hair and dander: Secondary | ICD-10-CM | POA: Diagnosis not present

## 2019-11-03 DIAGNOSIS — J3089 Other allergic rhinitis: Secondary | ICD-10-CM | POA: Diagnosis not present

## 2019-11-03 DIAGNOSIS — J301 Allergic rhinitis due to pollen: Secondary | ICD-10-CM | POA: Diagnosis not present

## 2019-11-09 DIAGNOSIS — J3081 Allergic rhinitis due to animal (cat) (dog) hair and dander: Secondary | ICD-10-CM | POA: Diagnosis not present

## 2019-11-09 DIAGNOSIS — J301 Allergic rhinitis due to pollen: Secondary | ICD-10-CM | POA: Diagnosis not present

## 2019-11-09 DIAGNOSIS — J3089 Other allergic rhinitis: Secondary | ICD-10-CM | POA: Diagnosis not present

## 2019-11-17 DIAGNOSIS — J3089 Other allergic rhinitis: Secondary | ICD-10-CM | POA: Diagnosis not present

## 2019-11-17 DIAGNOSIS — J3081 Allergic rhinitis due to animal (cat) (dog) hair and dander: Secondary | ICD-10-CM | POA: Diagnosis not present

## 2019-11-17 DIAGNOSIS — J301 Allergic rhinitis due to pollen: Secondary | ICD-10-CM | POA: Diagnosis not present

## 2019-11-25 DIAGNOSIS — J3081 Allergic rhinitis due to animal (cat) (dog) hair and dander: Secondary | ICD-10-CM | POA: Diagnosis not present

## 2019-11-25 DIAGNOSIS — J3089 Other allergic rhinitis: Secondary | ICD-10-CM | POA: Diagnosis not present

## 2019-11-25 DIAGNOSIS — J301 Allergic rhinitis due to pollen: Secondary | ICD-10-CM | POA: Diagnosis not present

## 2019-11-27 DIAGNOSIS — J3081 Allergic rhinitis due to animal (cat) (dog) hair and dander: Secondary | ICD-10-CM | POA: Diagnosis not present

## 2019-11-27 DIAGNOSIS — J3089 Other allergic rhinitis: Secondary | ICD-10-CM | POA: Diagnosis not present

## 2019-11-27 DIAGNOSIS — J301 Allergic rhinitis due to pollen: Secondary | ICD-10-CM | POA: Diagnosis not present

## 2019-11-30 DIAGNOSIS — J3081 Allergic rhinitis due to animal (cat) (dog) hair and dander: Secondary | ICD-10-CM | POA: Diagnosis not present

## 2019-11-30 DIAGNOSIS — J3089 Other allergic rhinitis: Secondary | ICD-10-CM | POA: Diagnosis not present

## 2019-11-30 DIAGNOSIS — J301 Allergic rhinitis due to pollen: Secondary | ICD-10-CM | POA: Diagnosis not present

## 2019-12-03 DIAGNOSIS — J3081 Allergic rhinitis due to animal (cat) (dog) hair and dander: Secondary | ICD-10-CM | POA: Diagnosis not present

## 2019-12-03 DIAGNOSIS — J301 Allergic rhinitis due to pollen: Secondary | ICD-10-CM | POA: Diagnosis not present

## 2019-12-03 DIAGNOSIS — J3089 Other allergic rhinitis: Secondary | ICD-10-CM | POA: Diagnosis not present

## 2019-12-10 DIAGNOSIS — J3081 Allergic rhinitis due to animal (cat) (dog) hair and dander: Secondary | ICD-10-CM | POA: Diagnosis not present

## 2019-12-10 DIAGNOSIS — J301 Allergic rhinitis due to pollen: Secondary | ICD-10-CM | POA: Diagnosis not present

## 2019-12-10 DIAGNOSIS — J3089 Other allergic rhinitis: Secondary | ICD-10-CM | POA: Diagnosis not present

## 2019-12-15 DIAGNOSIS — J3089 Other allergic rhinitis: Secondary | ICD-10-CM | POA: Diagnosis not present

## 2019-12-15 DIAGNOSIS — J3081 Allergic rhinitis due to animal (cat) (dog) hair and dander: Secondary | ICD-10-CM | POA: Diagnosis not present

## 2019-12-15 DIAGNOSIS — J301 Allergic rhinitis due to pollen: Secondary | ICD-10-CM | POA: Diagnosis not present

## 2019-12-22 DIAGNOSIS — J301 Allergic rhinitis due to pollen: Secondary | ICD-10-CM | POA: Diagnosis not present

## 2019-12-22 DIAGNOSIS — J3089 Other allergic rhinitis: Secondary | ICD-10-CM | POA: Diagnosis not present

## 2019-12-22 DIAGNOSIS — J3081 Allergic rhinitis due to animal (cat) (dog) hair and dander: Secondary | ICD-10-CM | POA: Diagnosis not present

## 2019-12-29 DIAGNOSIS — J3089 Other allergic rhinitis: Secondary | ICD-10-CM | POA: Diagnosis not present

## 2019-12-29 DIAGNOSIS — J3081 Allergic rhinitis due to animal (cat) (dog) hair and dander: Secondary | ICD-10-CM | POA: Diagnosis not present

## 2019-12-29 DIAGNOSIS — J301 Allergic rhinitis due to pollen: Secondary | ICD-10-CM | POA: Diagnosis not present

## 2019-12-30 ENCOUNTER — Encounter: Payer: Self-pay | Admitting: Gastroenterology

## 2019-12-30 ENCOUNTER — Ambulatory Visit: Payer: Medicare PPO | Admitting: Gastroenterology

## 2019-12-30 VITALS — BP 112/74 | HR 69 | Ht 66.0 in | Wt 148.4 lb

## 2019-12-30 DIAGNOSIS — K769 Liver disease, unspecified: Secondary | ICD-10-CM

## 2019-12-30 DIAGNOSIS — K5909 Other constipation: Secondary | ICD-10-CM | POA: Diagnosis not present

## 2019-12-30 MED ORDER — LUBIPROSTONE 8 MCG PO CAPS: 8.0000 ug | ORAL_CAPSULE | Freq: Two times a day (BID) | ORAL | 0 refills | Status: DC

## 2019-12-30 NOTE — Progress Notes (Signed)
[facility] Progress Note  Chief Complaint: Liver lesions  Subjective  History: I last saw Heather Fuller for a screening colonoscopy in August 2019.  Diffuse melanosis and a 5 mm descending colon tubular adenoma were found.  She was admitted in February of this year for acute cholecystitis with elevated LFTs.  Imaging noted below did not show choledocholithiasis, and she underwent laparoscopic cholecystectomy.  The MRI MRCP showed 2 small ill-defined liver lesions requiring follow-up in 6 months per radiology recommendation.   Heather Fuller recovered from her cholecystitis and has generally been feeling well.  She wanted to discuss the chronic constipation that has bothered her for the last 20 years.  She takes 2 senna-containing herbal supplements every morning and usually can have a BM once a day but does not always feel completely evacuated.  If she travels things are much worse, and might get a little relief from [facility] but has to take a large dose.  She recalls taking Amitiza prescribed by Dr. Sharlett Iles many years ago and seems to recall that it worked fairly well.  She had to stop taking it because insurance no longer covered it.  If she does not go to the bathroom for 5 to 7 days, she will feel bloated and uncomfortable but does not really have the urge to go to the bathroom.  She does not sit for prolonged periods and need to strain.  ROS: Cardiovascular:  no chest pain Respiratory: no dyspnea Upper respiratory allergies Remainder of systems negative except as above The patient's Past Medical, Family and Social History were reviewed and are on file in the EMR.  Objective:  Med list reviewed  Current Outpatient Medications:  .  budesonide-formoterol (SYMBICORT) 160-4.5 MCG/ACT inhaler, Inhale 2 puffs into the lungs as needed., Disp: , Rfl:  .  ipratropium (ATROVENT) 0.06 % nasal spray, Place 2 sprays into both nostrils 3 (three) times daily. , Disp: , Rfl: 3 .  levalbuterol  (XOPENEX HFA) 45 MCG/ACT inhaler, Inhale 1-2 puffs into the lungs as needed for wheezing., Disp: , Rfl:  .  levocetirizine (XYZAL) 5 MG tablet, Take 5 mg by mouth every evening., Disp: , Rfl:  .  montelukast (SINGULAIR) 10 MG tablet, Take 10 mg by mouth at bedtime., Disp: , Rfl:  .  lubiprostone (AMITIZA) 8 MCG capsule, Take 1 capsule (8 mcg total) by mouth 2 (two) times daily with a meal., Disp: 20 capsule, Rfl: 0   Vital signs in last 24 hrs: Vitals:   12/30/19 0856  BP: 112/74  Pulse: 69    Physical Exam  Well-appearing  HEENT: sclera anicteric, oral mucosa moist without lesions  Neck: supple, no thyromegaly, JVD or lymphadenopathy  Cardiac: RRR without murmurs, S1S2 heard, no peripheral edema  Pulm: clear to auscultation bilaterally, normal RR and effort noted  Abdomen: soft, no tenderness, with active bowel sounds. No guarding or palpable hepatosplenomegaly.  Skin; warm and dry, no jaundice or rash  Labs:   ___________________________________________ Radiologic studies: CLINICAL DATA:  Cholecystectomy for cholelithiasis and cholecystitis.   EXAM: INTRAOPERATIVE CHOLANGIOGRAM   TECHNIQUE: Cholangiographic images from the C-arm fluoroscopic device were submitted for interpretation post-operatively. Please see the procedural report for the amount of contrast and the fluoroscopy time utilized.   COMPARISON:  MRI on 07/06/2019   FINDINGS: Intraoperative imaging with a C-arm demonstrates mild prominence in caliber of the common bile duct without evidence of significant biliary obstruction or filling defect. Contrast is seen entering the duodenum. No contrast extravasation.  IMPRESSION: Mildly prominent common bile duct caliber without evidence of filling defect or biliary obstruction.     Electronically Signed   By: Aletta Edouard M.D.   On: 07/06/2019 15:37  _________________________________________________  CLINICAL DATA:  Right upper quadrant and  epigastric abdominal pain. Nausea and vomiting. Jaundice. Indeterminate liver lesion on recent ultrasound.   EXAM: MRI ABDOMEN WITHOUT AND WITH CONTRAST (INCLUDING MRCP)   TECHNIQUE: Multiplanar multisequence MR imaging of the abdomen was performed both before and after the administration of intravenous contrast. Heavily T2-weighted images of the biliary and pancreatic ducts were obtained, and three-dimensional MRCP images were rendered by post processing.   CONTRAST:  63mL GADAVIST GADOBUTROL 1 MMOL/ML IV SOLN   COMPARISON:  Ultrasound on 07/05/2019 and CT on 01/14/2013   FINDINGS: Lower chest: No acute findings.   Hepatobiliary: Image degradation by motion artifact noted. A 1.3 cm T2 hyperintense lesion is seen in segment 7 on image 13/17, which shows probable mild peripheral contrast enhancement, but has nonspecific characteristics. A similar lesion is seen in segment 2 which also measures 13 mm on image 32/26. Tiny sub-cm cyst is also seen in the segment 2 and segment 8. No evidence of steatosis on chemical shift imaging.   Multiple gallstones are seen. Mild diffuse gallbladder wall thickening and contrast enhancement, with mild pericholecystic edema, is consistent with acute cholecystitis. No evidence of biliary ductal dilatation, with common bile duct measuring 5 mm. No evidence of choledocholithiasis or biliary stricture.   Pancreas: No mass or inflammatory changes. No evidence of pancreatic ductal dilatation or pancreas divisum.   Spleen:  Within normal limits in size and appearance.   Adrenals/Urinary Tract: No masses identified. No evidence of hydronephrosis.   Stomach/Bowel: Visualized portion unremarkable.   Vascular/Lymphatic: No pathologically enlarged lymph nodes identified. No abdominal aortic aneurysm.   Other:  None.   Musculoskeletal:  No suspicious bone lesions identified.   IMPRESSION: 1. Findings consistent with acute cholecystitis. 2. No  evidence of biliary ductal dilatation, choledocholithiasis, or pancreatic mass. 3. Image degradation by motion artifact noted. Two small indeterminate liver lesions, largest measuring 1.3 cm. Recommend followup by abdomen MRI without and with contrast in 6 months. This recommendation follows ACR consensus guidelines: Management of Incidental Pancreatic Cysts: A White Paper of the ACR Incidental Findings Committee. [place] 1093;23:557-322.     Electronically Signed   By: Marlaine Hind M.D.   On: 07/06/2019 08:17    ____________________________________________ Other:   _____________________________________________ Assessment & Plan  Assessment: Encounter Diagnoses  Name Primary?  . Lesion of liver greater than 1 cm in diameter Yes  . Chronic constipation   . Liver disease    Incidental liver lesions on MRI during episode of cholecystitis.  Most likely benign.  Have scheduled MRI abdomen with and without contrast per radiology recommendation.  If stable, no follow-up will be needed for that.  Decades of constipation, sounds like for motility rather than pelvic floor dysfunction or dyssynergia.  Some improvement many years ago from [place].  I sent a prescription for that today but insurance denied it, requiring multiple other medicines to be tried and failed first.  We will have her pick up samples of Linzess and let us know how that works.  If not sufficiently effective or limited by side effects, she may be best continuing the senna, as promotility agent such as prucalopride should be used with caution at this age, Zelnorm is contraindicated over 65.  30 minutes were spent on this encounter (  including chart review, history/exam, counseling/coordination of care, and documentation)  Nelida Meuse III

## 2019-12-30 NOTE — Patient Instructions (Signed)
If you are age 65 or older, your body mass index should be between 23-30. Your Body mass index is 23.95 kg/m. If this is out of the aforementioned range listed, please consider follow up with your Primary Care Provider.  If you are age 21 or younger, your body mass index should be between 19-25. Your Body mass index is 23.95 kg/m. If this is out of the aformentioned range listed, please consider follow up with your Primary Care Provider.   You have been scheduled for an MRI at [place] on 01-07-2020. Your appointment time is 8am. Please arrive 15 minutes prior to your appointment time for registration purposes. Please make certain not to have anything to eat or drink 6 hours prior to your test. In addition, if you have any metal in your body, have a pacemaker or defibrillator, please be sure to let your ordering physician know. This test typically takes 45 minutes to 1 hour to complete. Should you need to reschedule, please call [phone] to do so.  It was a pleasure to see you today!  Dr. Loletha Carrow

## 2020-01-04 DIAGNOSIS — T63481A Toxic effect of venom of other arthropod, accidental (unintentional), initial encounter: Secondary | ICD-10-CM | POA: Diagnosis not present

## 2020-01-07 ENCOUNTER — Ambulatory Visit (HOSPITAL_COMMUNITY)
Admission: RE | Admit: 2020-01-07 | Discharge: 2020-01-07 | Disposition: A | Discharge status: Home / Self Care | Payer: Medicare PPO | Source: Ambulatory Visit | Attending: Gastroenterology | Admitting: Gastroenterology

## 2020-01-07 ENCOUNTER — Other Ambulatory Visit: Payer: Self-pay

## 2020-01-07 DIAGNOSIS — K769 Liver disease, unspecified: Secondary | ICD-10-CM | POA: Diagnosis not present

## 2020-01-07 DIAGNOSIS — K7689 Other specified diseases of liver: Secondary | ICD-10-CM | POA: Diagnosis not present

## 2020-01-07 DIAGNOSIS — Z9049 Acquired absence of other specified parts of digestive tract: Secondary | ICD-10-CM | POA: Diagnosis not present

## 2020-01-07 MED ORDER — GADOBUTROL 1 MMOL/ML IV SOLN
6.0000 mL | Freq: Once | INTRAVENOUS | Status: AC | PRN
  Administered 2020-01-07: 6 mL via INTRAVENOUS

## 2020-01-11 ENCOUNTER — Other Ambulatory Visit: Payer: Self-pay

## 2020-01-11 MED ORDER — LINACLOTIDE 72 MCG PO CAPS: 72.0000 ug | ORAL_CAPSULE | Freq: Every day | ORAL | 2 refills | Status: AC

## 2020-01-14 DIAGNOSIS — J3089 Other allergic rhinitis: Secondary | ICD-10-CM | POA: Diagnosis not present

## 2020-01-14 DIAGNOSIS — J301 Allergic rhinitis due to pollen: Secondary | ICD-10-CM | POA: Diagnosis not present

## 2020-01-14 DIAGNOSIS — J3081 Allergic rhinitis due to animal (cat) (dog) hair and dander: Secondary | ICD-10-CM | POA: Diagnosis not present

## 2020-01-22 DIAGNOSIS — J301 Allergic rhinitis due to pollen: Secondary | ICD-10-CM | POA: Diagnosis not present

## 2020-01-22 DIAGNOSIS — J3089 Other allergic rhinitis: Secondary | ICD-10-CM | POA: Diagnosis not present

## 2020-01-22 DIAGNOSIS — J3081 Allergic rhinitis due to animal (cat) (dog) hair and dander: Secondary | ICD-10-CM | POA: Diagnosis not present

## 2020-01-26 DIAGNOSIS — J4530 Mild persistent asthma, uncomplicated: Secondary | ICD-10-CM | POA: Diagnosis not present

## 2020-01-26 DIAGNOSIS — H1045 Other chronic allergic conjunctivitis: Secondary | ICD-10-CM | POA: Diagnosis not present

## 2020-01-26 DIAGNOSIS — J3089 Other allergic rhinitis: Secondary | ICD-10-CM | POA: Diagnosis not present

## 2020-01-26 DIAGNOSIS — K219 Gastro-esophageal reflux disease without esophagitis: Secondary | ICD-10-CM | POA: Diagnosis not present

## 2020-02-08 DIAGNOSIS — J301 Allergic rhinitis due to pollen: Secondary | ICD-10-CM | POA: Diagnosis not present

## 2020-02-08 DIAGNOSIS — J3081 Allergic rhinitis due to animal (cat) (dog) hair and dander: Secondary | ICD-10-CM | POA: Diagnosis not present

## 2020-02-08 DIAGNOSIS — J3089 Other allergic rhinitis: Secondary | ICD-10-CM | POA: Diagnosis not present

## 2020-02-15 DIAGNOSIS — J3089 Other allergic rhinitis: Secondary | ICD-10-CM | POA: Diagnosis not present

## 2020-02-15 DIAGNOSIS — J3081 Allergic rhinitis due to animal (cat) (dog) hair and dander: Secondary | ICD-10-CM | POA: Diagnosis not present

## 2020-02-15 DIAGNOSIS — J301 Allergic rhinitis due to pollen: Secondary | ICD-10-CM | POA: Diagnosis not present

## 2020-02-24 DIAGNOSIS — J3081 Allergic rhinitis due to animal (cat) (dog) hair and dander: Secondary | ICD-10-CM | POA: Diagnosis not present

## 2020-02-24 DIAGNOSIS — J301 Allergic rhinitis due to pollen: Secondary | ICD-10-CM | POA: Diagnosis not present

## 2020-02-24 DIAGNOSIS — J3089 Other allergic rhinitis: Secondary | ICD-10-CM | POA: Diagnosis not present

## 2020-03-01 DIAGNOSIS — J3081 Allergic rhinitis due to animal (cat) (dog) hair and dander: Secondary | ICD-10-CM | POA: Diagnosis not present

## 2020-03-01 DIAGNOSIS — J3089 Other allergic rhinitis: Secondary | ICD-10-CM | POA: Diagnosis not present

## 2020-03-01 DIAGNOSIS — J301 Allergic rhinitis due to pollen: Secondary | ICD-10-CM | POA: Diagnosis not present

## 2020-03-02 DIAGNOSIS — H52203 Unspecified astigmatism, bilateral: Secondary | ICD-10-CM | POA: Diagnosis not present

## 2020-03-02 DIAGNOSIS — H353112 Nonexudative age-related macular degeneration, right eye, intermediate dry stage: Secondary | ICD-10-CM | POA: Diagnosis not present

## 2020-03-02 DIAGNOSIS — H2513 Age-related nuclear cataract, bilateral: Secondary | ICD-10-CM | POA: Diagnosis not present

## 2020-03-02 DIAGNOSIS — H5213 Myopia, bilateral: Secondary | ICD-10-CM | POA: Diagnosis not present

## 2020-03-07 DIAGNOSIS — J3081 Allergic rhinitis due to animal (cat) (dog) hair and dander: Secondary | ICD-10-CM | POA: Diagnosis not present

## 2020-03-07 DIAGNOSIS — J3089 Other allergic rhinitis: Secondary | ICD-10-CM | POA: Diagnosis not present

## 2020-03-07 DIAGNOSIS — J301 Allergic rhinitis due to pollen: Secondary | ICD-10-CM | POA: Diagnosis not present

## 2020-03-14 DIAGNOSIS — J40 Bronchitis, not specified as acute or chronic: Secondary | ICD-10-CM | POA: Diagnosis not present

## 2020-03-14 DIAGNOSIS — Z8709 Personal history of other diseases of the respiratory system: Secondary | ICD-10-CM | POA: Diagnosis not present

## 2020-03-14 DIAGNOSIS — Z03818 Encounter for observation for suspected exposure to other biological agents ruled out: Secondary | ICD-10-CM | POA: Diagnosis not present

## 2020-03-14 DIAGNOSIS — R059 Cough, unspecified: Secondary | ICD-10-CM | POA: Diagnosis not present

## 2020-03-17 DIAGNOSIS — D2372 Other benign neoplasm of skin of left lower limb, including hip: Secondary | ICD-10-CM | POA: Diagnosis not present

## 2020-03-17 DIAGNOSIS — D1801 Hemangioma of skin and subcutaneous tissue: Secondary | ICD-10-CM | POA: Diagnosis not present

## 2020-03-17 DIAGNOSIS — L814 Other melanin hyperpigmentation: Secondary | ICD-10-CM | POA: Diagnosis not present

## 2020-03-17 DIAGNOSIS — Z85820 Personal history of malignant melanoma of skin: Secondary | ICD-10-CM | POA: Diagnosis not present

## 2020-03-17 DIAGNOSIS — Z85828 Personal history of other malignant neoplasm of skin: Secondary | ICD-10-CM | POA: Diagnosis not present

## 2020-03-17 DIAGNOSIS — L821 Other seborrheic keratosis: Secondary | ICD-10-CM | POA: Diagnosis not present

## 2020-03-17 DIAGNOSIS — L820 Inflamed seborrheic keratosis: Secondary | ICD-10-CM | POA: Diagnosis not present

## 2020-04-05 DIAGNOSIS — J3081 Allergic rhinitis due to animal (cat) (dog) hair and dander: Secondary | ICD-10-CM | POA: Diagnosis not present

## 2020-04-05 DIAGNOSIS — J301 Allergic rhinitis due to pollen: Secondary | ICD-10-CM | POA: Diagnosis not present

## 2020-04-05 DIAGNOSIS — J3089 Other allergic rhinitis: Secondary | ICD-10-CM | POA: Diagnosis not present

## 2020-04-18 DIAGNOSIS — J3089 Other allergic rhinitis: Secondary | ICD-10-CM | POA: Diagnosis not present

## 2020-04-18 DIAGNOSIS — J301 Allergic rhinitis due to pollen: Secondary | ICD-10-CM | POA: Diagnosis not present

## 2020-04-18 DIAGNOSIS — J3081 Allergic rhinitis due to animal (cat) (dog) hair and dander: Secondary | ICD-10-CM | POA: Diagnosis not present

## 2020-04-26 DIAGNOSIS — J301 Allergic rhinitis due to pollen: Secondary | ICD-10-CM | POA: Diagnosis not present

## 2020-04-26 DIAGNOSIS — J3081 Allergic rhinitis due to animal (cat) (dog) hair and dander: Secondary | ICD-10-CM | POA: Diagnosis not present

## 2020-04-26 DIAGNOSIS — J3089 Other allergic rhinitis: Secondary | ICD-10-CM | POA: Diagnosis not present

## 2020-04-28 DIAGNOSIS — J3081 Allergic rhinitis due to animal (cat) (dog) hair and dander: Secondary | ICD-10-CM | POA: Diagnosis not present

## 2020-04-28 DIAGNOSIS — J301 Allergic rhinitis due to pollen: Secondary | ICD-10-CM | POA: Diagnosis not present

## 2020-04-28 DIAGNOSIS — J3089 Other allergic rhinitis: Secondary | ICD-10-CM | POA: Diagnosis not present

## 2020-05-02 DIAGNOSIS — J3081 Allergic rhinitis due to animal (cat) (dog) hair and dander: Secondary | ICD-10-CM | POA: Diagnosis not present

## 2020-05-02 DIAGNOSIS — J3089 Other allergic rhinitis: Secondary | ICD-10-CM | POA: Diagnosis not present

## 2020-05-02 DIAGNOSIS — J301 Allergic rhinitis due to pollen: Secondary | ICD-10-CM | POA: Diagnosis not present

## 2020-05-09 DIAGNOSIS — J301 Allergic rhinitis due to pollen: Secondary | ICD-10-CM | POA: Diagnosis not present

## 2020-05-09 DIAGNOSIS — J3089 Other allergic rhinitis: Secondary | ICD-10-CM | POA: Diagnosis not present

## 2020-05-09 DIAGNOSIS — J3081 Allergic rhinitis due to animal (cat) (dog) hair and dander: Secondary | ICD-10-CM | POA: Diagnosis not present

## 2020-05-11 DIAGNOSIS — J301 Allergic rhinitis due to pollen: Secondary | ICD-10-CM | POA: Diagnosis not present

## 2020-05-11 DIAGNOSIS — J3081 Allergic rhinitis due to animal (cat) (dog) hair and dander: Secondary | ICD-10-CM | POA: Diagnosis not present

## 2020-05-11 DIAGNOSIS — J3089 Other allergic rhinitis: Secondary | ICD-10-CM | POA: Diagnosis not present

## 2020-05-17 DIAGNOSIS — J3089 Other allergic rhinitis: Secondary | ICD-10-CM | POA: Diagnosis not present

## 2020-05-17 DIAGNOSIS — J3081 Allergic rhinitis due to animal (cat) (dog) hair and dander: Secondary | ICD-10-CM | POA: Diagnosis not present

## 2020-05-17 DIAGNOSIS — Z6824 Body mass index (BMI) 24.0-24.9, adult: Secondary | ICD-10-CM | POA: Diagnosis not present

## 2020-05-17 DIAGNOSIS — Z124 Encounter for screening for malignant neoplasm of cervix: Secondary | ICD-10-CM | POA: Diagnosis not present

## 2020-05-17 DIAGNOSIS — J301 Allergic rhinitis due to pollen: Secondary | ICD-10-CM | POA: Diagnosis not present

## 2020-05-23 DIAGNOSIS — Z124 Encounter for screening for malignant neoplasm of cervix: Secondary | ICD-10-CM | POA: Diagnosis not present

## 2020-05-23 DIAGNOSIS — R87615 Unsatisfactory cytologic smear of cervix: Secondary | ICD-10-CM | POA: Diagnosis not present

## 2020-05-24 DIAGNOSIS — Z23 Encounter for immunization: Secondary | ICD-10-CM | POA: Diagnosis not present

## 2020-05-24 DIAGNOSIS — J3081 Allergic rhinitis due to animal (cat) (dog) hair and dander: Secondary | ICD-10-CM | POA: Diagnosis not present

## 2020-05-24 DIAGNOSIS — Z136 Encounter for screening for cardiovascular disorders: Secondary | ICD-10-CM | POA: Diagnosis not present

## 2020-05-24 DIAGNOSIS — J301 Allergic rhinitis due to pollen: Secondary | ICD-10-CM | POA: Diagnosis not present

## 2020-05-24 DIAGNOSIS — J3089 Other allergic rhinitis: Secondary | ICD-10-CM | POA: Diagnosis not present

## 2020-05-24 DIAGNOSIS — Z131 Encounter for screening for diabetes mellitus: Secondary | ICD-10-CM | POA: Diagnosis not present

## 2020-05-24 DIAGNOSIS — Z0000 Encounter for general adult medical examination without abnormal findings: Secondary | ICD-10-CM | POA: Diagnosis not present

## 2020-05-24 DIAGNOSIS — Z13220 Encounter for screening for lipoid disorders: Secondary | ICD-10-CM | POA: Diagnosis not present

## 2020-05-24 DIAGNOSIS — Z1159 Encounter for screening for other viral diseases: Secondary | ICD-10-CM | POA: Diagnosis not present

## 2020-05-31 DIAGNOSIS — J3089 Other allergic rhinitis: Secondary | ICD-10-CM | POA: Diagnosis not present

## 2020-05-31 DIAGNOSIS — J301 Allergic rhinitis due to pollen: Secondary | ICD-10-CM | POA: Diagnosis not present

## 2020-05-31 DIAGNOSIS — J3081 Allergic rhinitis due to animal (cat) (dog) hair and dander: Secondary | ICD-10-CM | POA: Diagnosis not present

## 2020-06-02 DIAGNOSIS — Z1272 Encounter for screening for malignant neoplasm of vagina: Secondary | ICD-10-CM | POA: Diagnosis not present

## 2020-06-02 DIAGNOSIS — Z90710 Acquired absence of both cervix and uterus: Secondary | ICD-10-CM | POA: Diagnosis not present

## 2020-06-07 DIAGNOSIS — J3089 Other allergic rhinitis: Secondary | ICD-10-CM | POA: Diagnosis not present

## 2020-06-07 DIAGNOSIS — J3081 Allergic rhinitis due to animal (cat) (dog) hair and dander: Secondary | ICD-10-CM | POA: Diagnosis not present

## 2020-06-07 DIAGNOSIS — J301 Allergic rhinitis due to pollen: Secondary | ICD-10-CM | POA: Diagnosis not present

## 2020-06-14 DIAGNOSIS — J3081 Allergic rhinitis due to animal (cat) (dog) hair and dander: Secondary | ICD-10-CM | POA: Diagnosis not present

## 2020-06-14 DIAGNOSIS — J3089 Other allergic rhinitis: Secondary | ICD-10-CM | POA: Diagnosis not present

## 2020-06-14 DIAGNOSIS — J301 Allergic rhinitis due to pollen: Secondary | ICD-10-CM | POA: Diagnosis not present

## 2020-06-16 DIAGNOSIS — J3089 Other allergic rhinitis: Secondary | ICD-10-CM | POA: Diagnosis not present

## 2020-06-16 DIAGNOSIS — J301 Allergic rhinitis due to pollen: Secondary | ICD-10-CM | POA: Diagnosis not present

## 2020-06-16 DIAGNOSIS — J3081 Allergic rhinitis due to animal (cat) (dog) hair and dander: Secondary | ICD-10-CM | POA: Diagnosis not present

## 2020-06-22 DIAGNOSIS — J301 Allergic rhinitis due to pollen: Secondary | ICD-10-CM | POA: Diagnosis not present

## 2020-06-22 DIAGNOSIS — J3081 Allergic rhinitis due to animal (cat) (dog) hair and dander: Secondary | ICD-10-CM | POA: Diagnosis not present

## 2020-06-22 DIAGNOSIS — J3089 Other allergic rhinitis: Secondary | ICD-10-CM | POA: Diagnosis not present

## 2020-06-23 DIAGNOSIS — J3081 Allergic rhinitis due to animal (cat) (dog) hair and dander: Secondary | ICD-10-CM | POA: Diagnosis not present

## 2020-06-28 DIAGNOSIS — J3089 Other allergic rhinitis: Secondary | ICD-10-CM | POA: Diagnosis not present

## 2020-06-28 DIAGNOSIS — J301 Allergic rhinitis due to pollen: Secondary | ICD-10-CM | POA: Diagnosis not present

## 2020-06-28 DIAGNOSIS — J3081 Allergic rhinitis due to animal (cat) (dog) hair and dander: Secondary | ICD-10-CM | POA: Diagnosis not present

## 2020-07-01 DIAGNOSIS — Z1231 Encounter for screening mammogram for malignant neoplasm of breast: Secondary | ICD-10-CM | POA: Diagnosis not present

## 2020-07-06 DIAGNOSIS — J3081 Allergic rhinitis due to animal (cat) (dog) hair and dander: Secondary | ICD-10-CM | POA: Diagnosis not present

## 2020-07-06 DIAGNOSIS — J301 Allergic rhinitis due to pollen: Secondary | ICD-10-CM | POA: Diagnosis not present

## 2020-07-06 DIAGNOSIS — J3089 Other allergic rhinitis: Secondary | ICD-10-CM | POA: Diagnosis not present

## 2020-07-12 DIAGNOSIS — J301 Allergic rhinitis due to pollen: Secondary | ICD-10-CM | POA: Diagnosis not present

## 2020-07-12 DIAGNOSIS — J3081 Allergic rhinitis due to animal (cat) (dog) hair and dander: Secondary | ICD-10-CM | POA: Diagnosis not present

## 2020-07-12 DIAGNOSIS — J3089 Other allergic rhinitis: Secondary | ICD-10-CM | POA: Diagnosis not present

## 2020-07-21 DIAGNOSIS — J301 Allergic rhinitis due to pollen: Secondary | ICD-10-CM | POA: Diagnosis not present

## 2020-07-21 DIAGNOSIS — J3081 Allergic rhinitis due to animal (cat) (dog) hair and dander: Secondary | ICD-10-CM | POA: Diagnosis not present

## 2020-07-26 DIAGNOSIS — J3089 Other allergic rhinitis: Secondary | ICD-10-CM | POA: Diagnosis not present

## 2020-07-26 DIAGNOSIS — J301 Allergic rhinitis due to pollen: Secondary | ICD-10-CM | POA: Diagnosis not present

## 2020-07-26 DIAGNOSIS — J3081 Allergic rhinitis due to animal (cat) (dog) hair and dander: Secondary | ICD-10-CM | POA: Diagnosis not present

## 2020-08-02 DIAGNOSIS — J3081 Allergic rhinitis due to animal (cat) (dog) hair and dander: Secondary | ICD-10-CM | POA: Diagnosis not present

## 2020-08-02 DIAGNOSIS — J301 Allergic rhinitis due to pollen: Secondary | ICD-10-CM | POA: Diagnosis not present

## 2020-08-02 DIAGNOSIS — J3089 Other allergic rhinitis: Secondary | ICD-10-CM | POA: Diagnosis not present

## 2020-08-08 DIAGNOSIS — J3081 Allergic rhinitis due to animal (cat) (dog) hair and dander: Secondary | ICD-10-CM | POA: Diagnosis not present

## 2020-08-08 DIAGNOSIS — J301 Allergic rhinitis due to pollen: Secondary | ICD-10-CM | POA: Diagnosis not present

## 2020-08-08 DIAGNOSIS — J3089 Other allergic rhinitis: Secondary | ICD-10-CM | POA: Diagnosis not present

## 2020-08-15 DIAGNOSIS — J3081 Allergic rhinitis due to animal (cat) (dog) hair and dander: Secondary | ICD-10-CM | POA: Diagnosis not present

## 2020-08-15 DIAGNOSIS — J3089 Other allergic rhinitis: Secondary | ICD-10-CM | POA: Diagnosis not present

## 2020-08-15 DIAGNOSIS — J301 Allergic rhinitis due to pollen: Secondary | ICD-10-CM | POA: Diagnosis not present

## 2020-08-22 DIAGNOSIS — J301 Allergic rhinitis due to pollen: Secondary | ICD-10-CM | POA: Diagnosis not present

## 2020-08-22 DIAGNOSIS — J3089 Other allergic rhinitis: Secondary | ICD-10-CM | POA: Diagnosis not present

## 2020-08-22 DIAGNOSIS — J3081 Allergic rhinitis due to animal (cat) (dog) hair and dander: Secondary | ICD-10-CM | POA: Diagnosis not present

## 2020-08-31 DIAGNOSIS — J3081 Allergic rhinitis due to animal (cat) (dog) hair and dander: Secondary | ICD-10-CM | POA: Diagnosis not present

## 2020-08-31 DIAGNOSIS — J301 Allergic rhinitis due to pollen: Secondary | ICD-10-CM | POA: Diagnosis not present

## 2020-08-31 DIAGNOSIS — J3089 Other allergic rhinitis: Secondary | ICD-10-CM | POA: Diagnosis not present

## 2020-09-13 DIAGNOSIS — J3081 Allergic rhinitis due to animal (cat) (dog) hair and dander: Secondary | ICD-10-CM | POA: Diagnosis not present

## 2020-09-13 DIAGNOSIS — J301 Allergic rhinitis due to pollen: Secondary | ICD-10-CM | POA: Diagnosis not present

## 2020-09-13 DIAGNOSIS — J3089 Other allergic rhinitis: Secondary | ICD-10-CM | POA: Diagnosis not present

## 2020-09-20 DIAGNOSIS — J3089 Other allergic rhinitis: Secondary | ICD-10-CM | POA: Diagnosis not present

## 2020-09-20 DIAGNOSIS — J3081 Allergic rhinitis due to animal (cat) (dog) hair and dander: Secondary | ICD-10-CM | POA: Diagnosis not present

## 2020-09-20 DIAGNOSIS — J301 Allergic rhinitis due to pollen: Secondary | ICD-10-CM | POA: Diagnosis not present

## 2020-09-27 DIAGNOSIS — J301 Allergic rhinitis due to pollen: Secondary | ICD-10-CM | POA: Diagnosis not present

## 2020-09-27 DIAGNOSIS — J3081 Allergic rhinitis due to animal (cat) (dog) hair and dander: Secondary | ICD-10-CM | POA: Diagnosis not present

## 2020-09-27 DIAGNOSIS — J3089 Other allergic rhinitis: Secondary | ICD-10-CM | POA: Diagnosis not present

## 2020-10-04 DIAGNOSIS — J301 Allergic rhinitis due to pollen: Secondary | ICD-10-CM | POA: Diagnosis not present

## 2020-10-04 DIAGNOSIS — J3081 Allergic rhinitis due to animal (cat) (dog) hair and dander: Secondary | ICD-10-CM | POA: Diagnosis not present

## 2020-10-04 DIAGNOSIS — J3089 Other allergic rhinitis: Secondary | ICD-10-CM | POA: Diagnosis not present

## 2020-10-12 DIAGNOSIS — J301 Allergic rhinitis due to pollen: Secondary | ICD-10-CM | POA: Diagnosis not present

## 2020-10-12 DIAGNOSIS — J3089 Other allergic rhinitis: Secondary | ICD-10-CM | POA: Diagnosis not present

## 2020-10-12 DIAGNOSIS — J3081 Allergic rhinitis due to animal (cat) (dog) hair and dander: Secondary | ICD-10-CM | POA: Diagnosis not present

## 2020-10-17 DIAGNOSIS — Z20822 Contact with and (suspected) exposure to covid-19: Secondary | ICD-10-CM | POA: Diagnosis not present

## 2020-10-18 DIAGNOSIS — K219 Gastro-esophageal reflux disease without esophagitis: Secondary | ICD-10-CM | POA: Diagnosis not present

## 2020-10-18 DIAGNOSIS — J4530 Mild persistent asthma, uncomplicated: Secondary | ICD-10-CM | POA: Diagnosis not present

## 2020-10-18 DIAGNOSIS — J209 Acute bronchitis, unspecified: Secondary | ICD-10-CM | POA: Diagnosis not present

## 2020-10-18 DIAGNOSIS — J3089 Other allergic rhinitis: Secondary | ICD-10-CM | POA: Diagnosis not present

## 2020-11-03 DIAGNOSIS — J3081 Allergic rhinitis due to animal (cat) (dog) hair and dander: Secondary | ICD-10-CM | POA: Diagnosis not present

## 2020-11-03 DIAGNOSIS — J3089 Other allergic rhinitis: Secondary | ICD-10-CM | POA: Diagnosis not present

## 2020-11-03 DIAGNOSIS — J301 Allergic rhinitis due to pollen: Secondary | ICD-10-CM | POA: Diagnosis not present

## 2020-11-08 DIAGNOSIS — J3081 Allergic rhinitis due to animal (cat) (dog) hair and dander: Secondary | ICD-10-CM | POA: Diagnosis not present

## 2020-11-08 DIAGNOSIS — J3089 Other allergic rhinitis: Secondary | ICD-10-CM | POA: Diagnosis not present

## 2020-11-08 DIAGNOSIS — J301 Allergic rhinitis due to pollen: Secondary | ICD-10-CM | POA: Diagnosis not present

## 2020-11-15 DIAGNOSIS — M7051 Other bursitis of knee, right knee: Secondary | ICD-10-CM | POA: Diagnosis not present

## 2020-11-15 DIAGNOSIS — J3089 Other allergic rhinitis: Secondary | ICD-10-CM | POA: Diagnosis not present

## 2020-11-15 DIAGNOSIS — J301 Allergic rhinitis due to pollen: Secondary | ICD-10-CM | POA: Diagnosis not present

## 2020-11-15 DIAGNOSIS — J3081 Allergic rhinitis due to animal (cat) (dog) hair and dander: Secondary | ICD-10-CM | POA: Diagnosis not present

## 2020-11-21 DIAGNOSIS — J3089 Other allergic rhinitis: Secondary | ICD-10-CM | POA: Diagnosis not present

## 2020-11-21 DIAGNOSIS — J301 Allergic rhinitis due to pollen: Secondary | ICD-10-CM | POA: Diagnosis not present

## 2020-11-21 DIAGNOSIS — J3081 Allergic rhinitis due to animal (cat) (dog) hair and dander: Secondary | ICD-10-CM | POA: Diagnosis not present

## 2020-11-30 DIAGNOSIS — J301 Allergic rhinitis due to pollen: Secondary | ICD-10-CM | POA: Diagnosis not present

## 2020-11-30 DIAGNOSIS — J3089 Other allergic rhinitis: Secondary | ICD-10-CM | POA: Diagnosis not present

## 2020-11-30 DIAGNOSIS — J3081 Allergic rhinitis due to animal (cat) (dog) hair and dander: Secondary | ICD-10-CM | POA: Diagnosis not present

## 2020-12-06 DIAGNOSIS — J3089 Other allergic rhinitis: Secondary | ICD-10-CM | POA: Diagnosis not present

## 2020-12-06 DIAGNOSIS — J301 Allergic rhinitis due to pollen: Secondary | ICD-10-CM | POA: Diagnosis not present

## 2020-12-06 DIAGNOSIS — J3081 Allergic rhinitis due to animal (cat) (dog) hair and dander: Secondary | ICD-10-CM | POA: Diagnosis not present

## 2020-12-12 DIAGNOSIS — J3089 Other allergic rhinitis: Secondary | ICD-10-CM | POA: Diagnosis not present

## 2020-12-12 DIAGNOSIS — J301 Allergic rhinitis due to pollen: Secondary | ICD-10-CM | POA: Diagnosis not present

## 2020-12-12 DIAGNOSIS — J3081 Allergic rhinitis due to animal (cat) (dog) hair and dander: Secondary | ICD-10-CM | POA: Diagnosis not present

## 2020-12-29 DIAGNOSIS — J3081 Allergic rhinitis due to animal (cat) (dog) hair and dander: Secondary | ICD-10-CM | POA: Diagnosis not present

## 2020-12-29 DIAGNOSIS — J3089 Other allergic rhinitis: Secondary | ICD-10-CM | POA: Diagnosis not present

## 2020-12-29 DIAGNOSIS — J301 Allergic rhinitis due to pollen: Secondary | ICD-10-CM | POA: Diagnosis not present

## 2021-01-04 DIAGNOSIS — J3081 Allergic rhinitis due to animal (cat) (dog) hair and dander: Secondary | ICD-10-CM | POA: Diagnosis not present

## 2021-01-04 DIAGNOSIS — J3089 Other allergic rhinitis: Secondary | ICD-10-CM | POA: Diagnosis not present

## 2021-01-04 DIAGNOSIS — J301 Allergic rhinitis due to pollen: Secondary | ICD-10-CM | POA: Diagnosis not present

## 2021-01-10 DIAGNOSIS — J3089 Other allergic rhinitis: Secondary | ICD-10-CM | POA: Diagnosis not present

## 2021-01-10 DIAGNOSIS — J3081 Allergic rhinitis due to animal (cat) (dog) hair and dander: Secondary | ICD-10-CM | POA: Diagnosis not present

## 2021-01-10 DIAGNOSIS — J301 Allergic rhinitis due to pollen: Secondary | ICD-10-CM | POA: Diagnosis not present

## 2021-01-18 DIAGNOSIS — J3089 Other allergic rhinitis: Secondary | ICD-10-CM | POA: Diagnosis not present

## 2021-01-18 DIAGNOSIS — J301 Allergic rhinitis due to pollen: Secondary | ICD-10-CM | POA: Diagnosis not present

## 2021-01-18 DIAGNOSIS — J3081 Allergic rhinitis due to animal (cat) (dog) hair and dander: Secondary | ICD-10-CM | POA: Diagnosis not present

## 2021-01-25 DIAGNOSIS — H1045 Other chronic allergic conjunctivitis: Secondary | ICD-10-CM | POA: Diagnosis not present

## 2021-01-25 DIAGNOSIS — K219 Gastro-esophageal reflux disease without esophagitis: Secondary | ICD-10-CM | POA: Diagnosis not present

## 2021-01-25 DIAGNOSIS — J3081 Allergic rhinitis due to animal (cat) (dog) hair and dander: Secondary | ICD-10-CM | POA: Diagnosis not present

## 2021-01-25 DIAGNOSIS — J4530 Mild persistent asthma, uncomplicated: Secondary | ICD-10-CM | POA: Diagnosis not present

## 2021-01-25 DIAGNOSIS — J301 Allergic rhinitis due to pollen: Secondary | ICD-10-CM | POA: Diagnosis not present

## 2021-01-25 DIAGNOSIS — J3089 Other allergic rhinitis: Secondary | ICD-10-CM | POA: Diagnosis not present

## 2021-02-01 DIAGNOSIS — J3081 Allergic rhinitis due to animal (cat) (dog) hair and dander: Secondary | ICD-10-CM | POA: Diagnosis not present

## 2021-02-01 DIAGNOSIS — J3089 Other allergic rhinitis: Secondary | ICD-10-CM | POA: Diagnosis not present

## 2021-02-01 DIAGNOSIS — J301 Allergic rhinitis due to pollen: Secondary | ICD-10-CM | POA: Diagnosis not present

## 2021-02-07 DIAGNOSIS — J3089 Other allergic rhinitis: Secondary | ICD-10-CM | POA: Diagnosis not present

## 2021-02-07 DIAGNOSIS — J301 Allergic rhinitis due to pollen: Secondary | ICD-10-CM | POA: Diagnosis not present

## 2021-02-07 DIAGNOSIS — J3081 Allergic rhinitis due to animal (cat) (dog) hair and dander: Secondary | ICD-10-CM | POA: Diagnosis not present

## 2021-02-14 DIAGNOSIS — J301 Allergic rhinitis due to pollen: Secondary | ICD-10-CM | POA: Diagnosis not present

## 2021-02-14 DIAGNOSIS — J3089 Other allergic rhinitis: Secondary | ICD-10-CM | POA: Diagnosis not present

## 2021-02-20 DIAGNOSIS — K219 Gastro-esophageal reflux disease without esophagitis: Secondary | ICD-10-CM | POA: Diagnosis not present

## 2021-02-20 DIAGNOSIS — J3089 Other allergic rhinitis: Secondary | ICD-10-CM | POA: Diagnosis not present

## 2021-02-20 DIAGNOSIS — J4530 Mild persistent asthma, uncomplicated: Secondary | ICD-10-CM | POA: Diagnosis not present

## 2021-02-20 DIAGNOSIS — R21 Rash and other nonspecific skin eruption: Secondary | ICD-10-CM | POA: Diagnosis not present

## 2021-03-08 DIAGNOSIS — H353132 Nonexudative age-related macular degeneration, bilateral, intermediate dry stage: Secondary | ICD-10-CM | POA: Diagnosis not present

## 2021-03-08 DIAGNOSIS — H2513 Age-related nuclear cataract, bilateral: Secondary | ICD-10-CM | POA: Diagnosis not present

## 2021-03-08 DIAGNOSIS — H5213 Myopia, bilateral: Secondary | ICD-10-CM | POA: Diagnosis not present

## 2021-03-14 DIAGNOSIS — J301 Allergic rhinitis due to pollen: Secondary | ICD-10-CM | POA: Diagnosis not present

## 2021-03-14 DIAGNOSIS — J3089 Other allergic rhinitis: Secondary | ICD-10-CM | POA: Diagnosis not present

## 2021-03-14 DIAGNOSIS — J3081 Allergic rhinitis due to animal (cat) (dog) hair and dander: Secondary | ICD-10-CM | POA: Diagnosis not present

## 2021-03-22 DIAGNOSIS — J301 Allergic rhinitis due to pollen: Secondary | ICD-10-CM | POA: Diagnosis not present

## 2021-03-22 DIAGNOSIS — J3081 Allergic rhinitis due to animal (cat) (dog) hair and dander: Secondary | ICD-10-CM | POA: Diagnosis not present

## 2021-03-22 DIAGNOSIS — J3089 Other allergic rhinitis: Secondary | ICD-10-CM | POA: Diagnosis not present

## 2021-03-28 DIAGNOSIS — J301 Allergic rhinitis due to pollen: Secondary | ICD-10-CM | POA: Diagnosis not present

## 2021-03-28 DIAGNOSIS — J3081 Allergic rhinitis due to animal (cat) (dog) hair and dander: Secondary | ICD-10-CM | POA: Diagnosis not present

## 2021-03-28 DIAGNOSIS — J3089 Other allergic rhinitis: Secondary | ICD-10-CM | POA: Diagnosis not present

## 2021-04-03 DIAGNOSIS — J3089 Other allergic rhinitis: Secondary | ICD-10-CM | POA: Diagnosis not present

## 2021-04-03 DIAGNOSIS — J301 Allergic rhinitis due to pollen: Secondary | ICD-10-CM | POA: Diagnosis not present

## 2021-04-03 DIAGNOSIS — J3081 Allergic rhinitis due to animal (cat) (dog) hair and dander: Secondary | ICD-10-CM | POA: Diagnosis not present

## 2021-04-11 DIAGNOSIS — J301 Allergic rhinitis due to pollen: Secondary | ICD-10-CM | POA: Diagnosis not present

## 2021-04-11 DIAGNOSIS — J3081 Allergic rhinitis due to animal (cat) (dog) hair and dander: Secondary | ICD-10-CM | POA: Diagnosis not present

## 2021-04-11 DIAGNOSIS — J3089 Other allergic rhinitis: Secondary | ICD-10-CM | POA: Diagnosis not present

## 2021-04-19 DIAGNOSIS — J301 Allergic rhinitis due to pollen: Secondary | ICD-10-CM | POA: Diagnosis not present

## 2021-04-19 DIAGNOSIS — J3081 Allergic rhinitis due to animal (cat) (dog) hair and dander: Secondary | ICD-10-CM | POA: Diagnosis not present

## 2021-04-19 DIAGNOSIS — J3089 Other allergic rhinitis: Secondary | ICD-10-CM | POA: Diagnosis not present

## 2021-04-25 DIAGNOSIS — J301 Allergic rhinitis due to pollen: Secondary | ICD-10-CM | POA: Diagnosis not present

## 2021-04-25 DIAGNOSIS — J3081 Allergic rhinitis due to animal (cat) (dog) hair and dander: Secondary | ICD-10-CM | POA: Diagnosis not present

## 2021-04-25 DIAGNOSIS — J3089 Other allergic rhinitis: Secondary | ICD-10-CM | POA: Diagnosis not present

## 2021-05-02 DIAGNOSIS — J3089 Other allergic rhinitis: Secondary | ICD-10-CM | POA: Diagnosis not present

## 2021-05-02 DIAGNOSIS — J3081 Allergic rhinitis due to animal (cat) (dog) hair and dander: Secondary | ICD-10-CM | POA: Diagnosis not present

## 2021-05-02 DIAGNOSIS — J301 Allergic rhinitis due to pollen: Secondary | ICD-10-CM | POA: Diagnosis not present

## 2021-05-16 DIAGNOSIS — J3081 Allergic rhinitis due to animal (cat) (dog) hair and dander: Secondary | ICD-10-CM | POA: Diagnosis not present

## 2021-05-16 DIAGNOSIS — J3089 Other allergic rhinitis: Secondary | ICD-10-CM | POA: Diagnosis not present

## 2021-05-16 DIAGNOSIS — J301 Allergic rhinitis due to pollen: Secondary | ICD-10-CM | POA: Diagnosis not present

## 2021-05-18 DIAGNOSIS — Z01419 Encounter for gynecological examination (general) (routine) without abnormal findings: Secondary | ICD-10-CM | POA: Diagnosis not present

## 2021-05-18 DIAGNOSIS — Z6825 Body mass index (BMI) 25.0-25.9, adult: Secondary | ICD-10-CM | POA: Diagnosis not present

## 2021-06-08 DIAGNOSIS — J301 Allergic rhinitis due to pollen: Secondary | ICD-10-CM | POA: Diagnosis not present

## 2021-06-08 DIAGNOSIS — J3089 Other allergic rhinitis: Secondary | ICD-10-CM | POA: Diagnosis not present

## 2021-06-08 DIAGNOSIS — J3081 Allergic rhinitis due to animal (cat) (dog) hair and dander: Secondary | ICD-10-CM | POA: Diagnosis not present

## 2021-06-13 DIAGNOSIS — J301 Allergic rhinitis due to pollen: Secondary | ICD-10-CM | POA: Diagnosis not present

## 2021-06-13 DIAGNOSIS — J3089 Other allergic rhinitis: Secondary | ICD-10-CM | POA: Diagnosis not present

## 2021-06-13 DIAGNOSIS — J3081 Allergic rhinitis due to animal (cat) (dog) hair and dander: Secondary | ICD-10-CM | POA: Diagnosis not present

## 2021-06-23 DIAGNOSIS — J3089 Other allergic rhinitis: Secondary | ICD-10-CM | POA: Diagnosis not present

## 2021-06-23 DIAGNOSIS — J3081 Allergic rhinitis due to animal (cat) (dog) hair and dander: Secondary | ICD-10-CM | POA: Diagnosis not present

## 2021-06-23 DIAGNOSIS — J301 Allergic rhinitis due to pollen: Secondary | ICD-10-CM | POA: Diagnosis not present

## 2021-06-25 IMAGING — MR MR ABDOMEN WO/W CM
18 of 19 series · 46 of 48 positions shown · IV contrast (gadavist)
Comparison: 07/06/2019

CLINICAL DATA: Follow-up liver lesion identified on MRI.
Postcholecystectomy.

EXAM:
MRI ABDOMEN WITHOUT AND WITH CONTRAST
TECHNIQUE: Multiplanar multisequence MR imaging of the abdomen was performed
both before and after the administration of intravenous contrast.
CONTRAST:  6mL GADAVIST GADOBUTROL 1 MMOL/ML IV SOLN

[Series 3: T2 · coronal · 6.0mm · 1.56mm/px · 2 of 30 slices shown (1 of 2)]
[im 1/30]
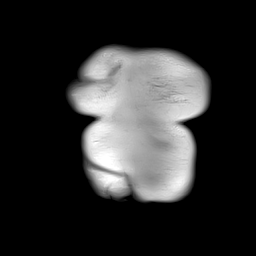
[im 30/30]
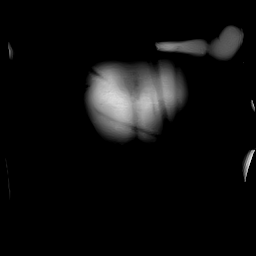

[Series 5: T2 fat-sat · axial · 6.0mm · 1.25mm/px · z∈[-177,+75]mm · 2 of 36 slices shown]
[im 1/36]
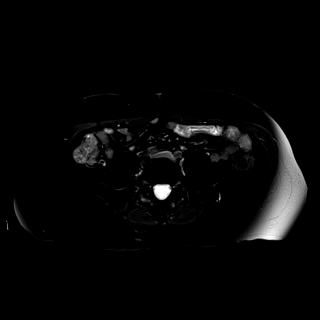
[im 36/36]
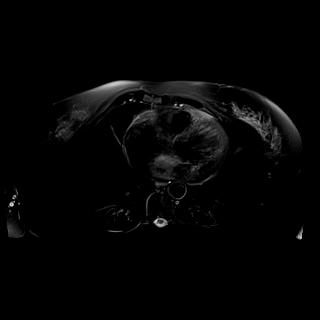

[Series 6: T1 · axial · 3.0mm · 1.16mm/px · z∈[-184,+77]mm · 3 of 88 slices shown (1 of 2)]
[im 1/88]
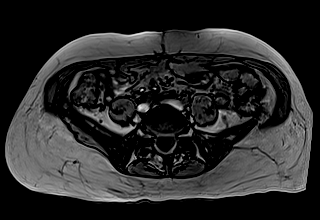
[im 44/88]
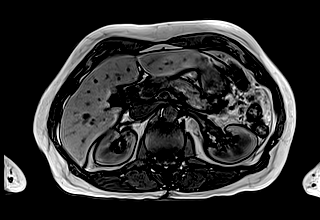
[im 88/88]
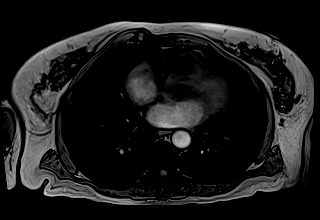

[Series 7: T1 · axial · 3.0mm · 1.16mm/px · z∈[-184,+77]mm · 3 of 88 slices shown (2 of 2)]
[im 1/88]
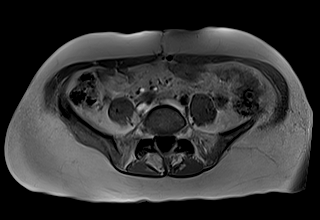
[im 44/88]
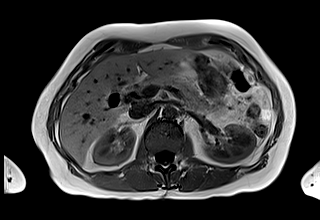
[im 88/88]
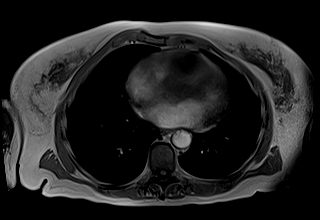

[Series 8: DWI · axial · 6.0mm · 1.38mm/px · z∈[-191,+104]mm · 3 of 84 slices shown (1 of 2)]
[im 1/84]
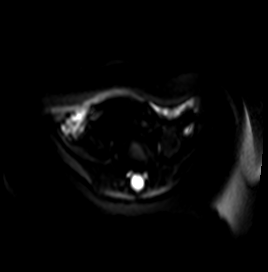
[im 42/84]
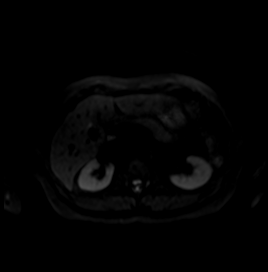
[im 84/84]
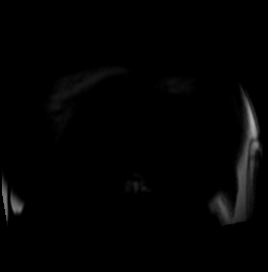

[Series 9: DWI · axial · 6.0mm · 1.38mm/px · 1 of 42 slices shown (2 of 2)]
[im 1/42]
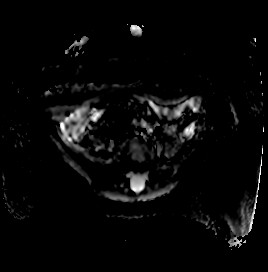

[Series 10: bSSFP · axial · 4.0mm · 0.72mm/px · z∈[-178,+86]mm · 2 of 67 slices shown]
[im 1/67]
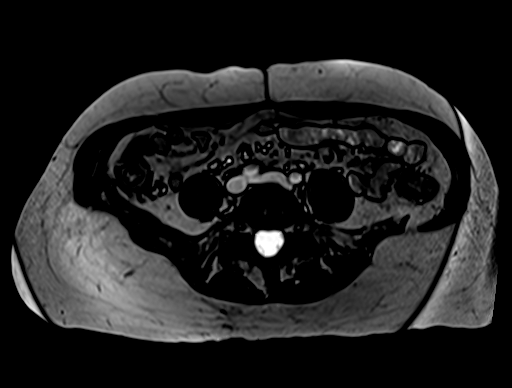
[im 67/67]
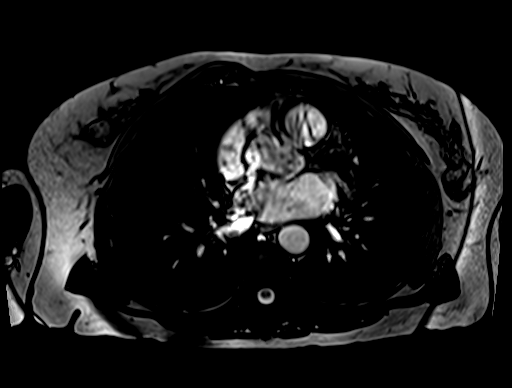

[Series 12: T1 dynamic · axial · 3.0mm · 1.16mm/px · z∈[-181,+80]mm · 3 of 88 slices shown (1 of 6)]
[im 1/88]
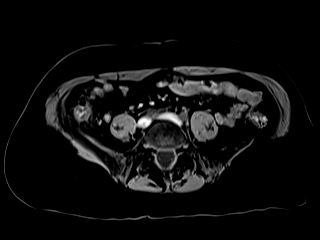
[im 44/88]
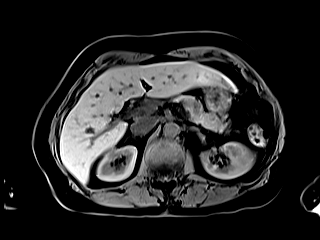
[im 88/88]
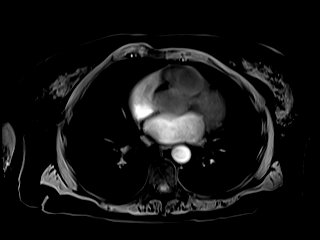

[Series 15: T1 dynamic · axial · 3.0mm · 1.16mm/px · z∈[-181,+80]mm · 3 of 88 slices shown (2 of 6)]
[im 1/88]
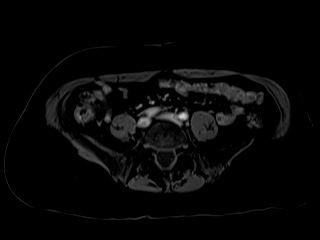
[im 44/88]
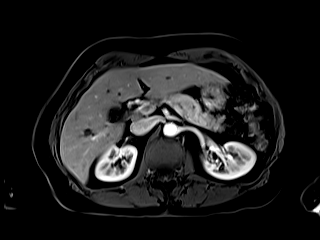
[im 88/88]
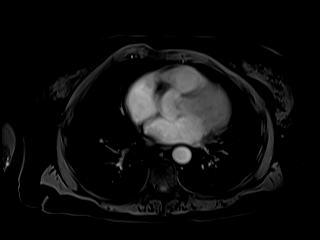

[Series 17: T1 dynamic · axial · 3.0mm · 1.16mm/px · z∈[-181,+80]mm · 3 of 88 slices shown (3 of 6)]
[im 1/88]
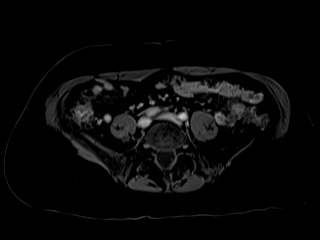
[im 44/88]
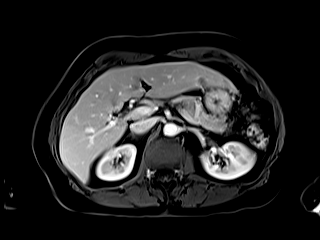
[im 88/88]
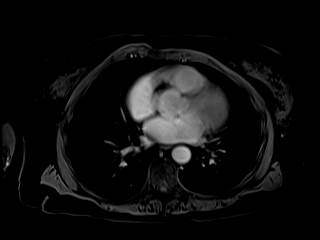

[Series 19: T1 dynamic · axial · 3.0mm · 1.16mm/px · z∈[-181,+80]mm · 3 of 88 slices shown (4 of 6)]
[im 1/88]
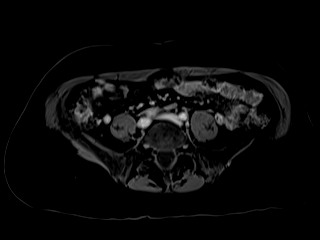
[im 44/88]
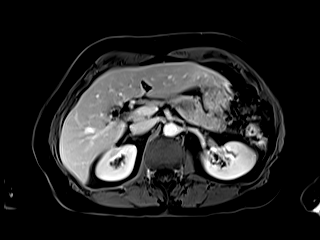
[im 88/88]
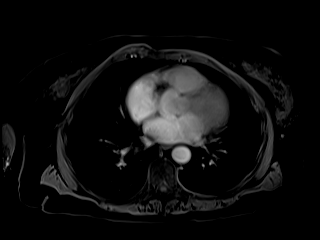

[Series 21: T1 dynamic · coronal · 4.0mm · 1.41mm/px · 2 of 56 slices shown (5 of 6)]
[im 1/56]
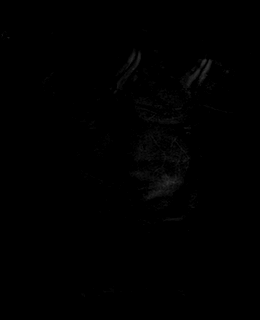
[im 56/56]
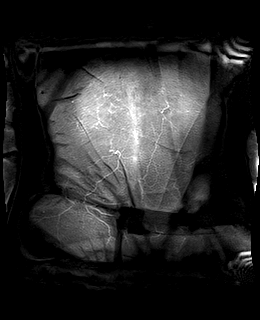

[Series 22: T2 · axial · 6.0mm · 1.45mm/px · 1 of 42 slices shown (2 of 2)]
[im 1/42]
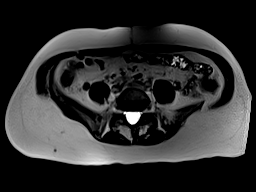

[Series 24: T1 dynamic · axial · 3.0mm · 1.16mm/px · z∈[-181,+80]mm · 3 of 88 slices shown (6 of 6)]
[im 1/88]
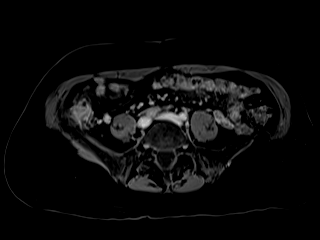
[im 44/88]
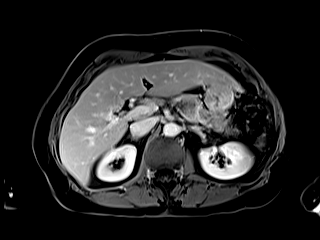
[im 88/88]
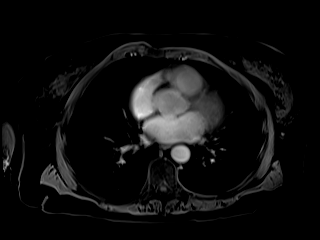

[Series 100: sub 20 sec · axial · 3.0mm · 1.16mm/px · z∈[-181,+80]mm · 3 of 88 slices shown]
[im 1/88]
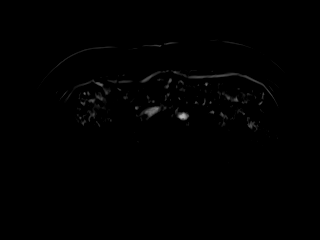
[im 44/88]
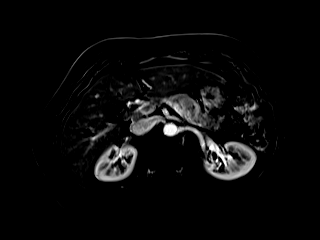
[im 88/88]
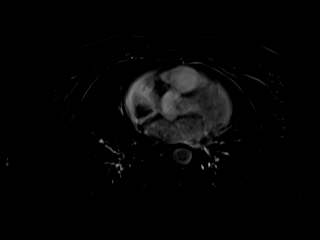

[Series 101: sub 45 sec · axial · 3.0mm · 1.16mm/px · z∈[-181,+80]mm · 3 of 88 slices shown]
[im 1/88]
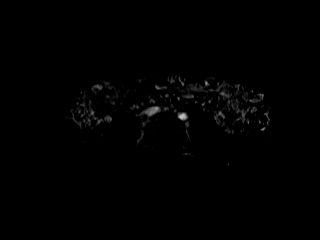
[im 44/88]
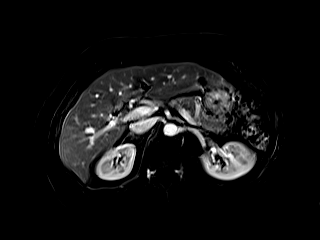
[im 88/88]
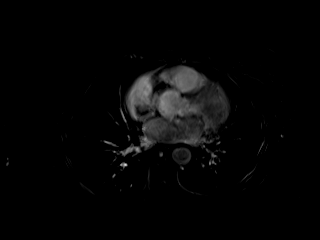

[Series 102: sub 90 sec · axial · 3.0mm · 1.16mm/px · z∈[-181,+80]mm · 3 of 88 slices shown]
[im 1/88]
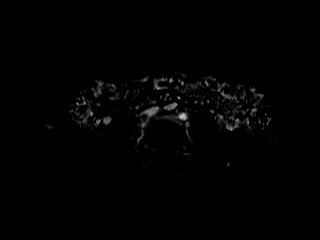
[im 44/88]
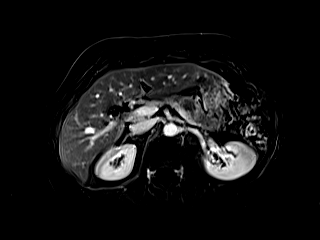
[im 88/88]
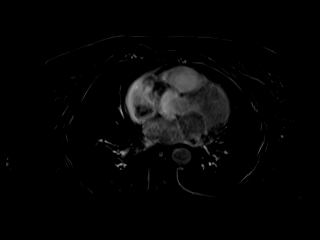

[Series 103: sub 3 min · axial · 3.0mm · 1.16mm/px · z∈[-181,+80]mm · 3 of 88 slices shown]
[im 1/88]
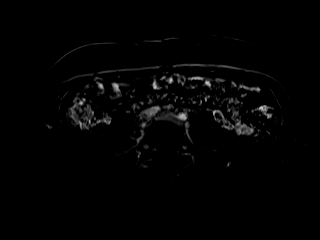
[im 44/88]
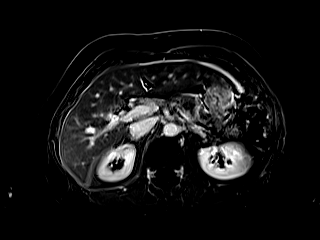
[im 88/88]
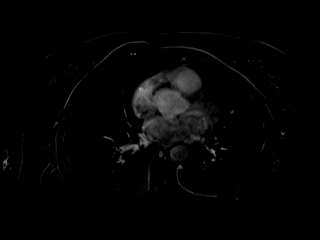

[46 of 48 positions shown; findings below may reference images not displayed]

FINDINGS: Lower chest:  Lung bases are clear.

Hepatobiliary: Several well-circumscribed lesions which are
hyperintense on T2 weighted imaging again demonstrated and not
changed from prior. 15 mm lesion within the LEFT hepatic lobe on
image [DATE]. Adjacent 9 mm lesion on image [DATE]. Lesion the posterior
RIGHT hepatic lobe measuring 10 mm on image [DATE].

These lesions have peripheral discontinuous enhancement which
progresses on the more delayed series. No central filling however
combination of findings is most consistent with benign hemangiomas.

Postcholecystectomy.  No biliary duct dilatation.

Pancreas: Normal pancreatic parenchymal intensity. No ductal
dilatation or inflammation. Small periampullary duodenum
diverticulum (7 mm: image 22/series 5).

Spleen: Normal spleen.

Adrenals/urinary tract: Adrenal glands and kidneys are normal.

Stomach/Bowel: Stomach and limited of the small bowel is
unremarkable

Vascular/Lymphatic: Abdominal aortic normal caliber. No
retroperitoneal periportal lymphadenopathy.

Musculoskeletal: No aggressive osseous lesion
IMPRESSION: 1. Stable lesions in the LEFT and RIGHT hepatic lobe have imaging
features most consistent benign hemangiomas. No specific follow-up
recommended.
2. No complication following cholecystectomy.
3. Small periampullary duodenum diverticulum.

## 2021-06-27 DIAGNOSIS — J3089 Other allergic rhinitis: Secondary | ICD-10-CM | POA: Diagnosis not present

## 2021-06-27 DIAGNOSIS — J3081 Allergic rhinitis due to animal (cat) (dog) hair and dander: Secondary | ICD-10-CM | POA: Diagnosis not present

## 2021-06-27 DIAGNOSIS — J301 Allergic rhinitis due to pollen: Secondary | ICD-10-CM | POA: Diagnosis not present

## 2021-07-04 DIAGNOSIS — J3081 Allergic rhinitis due to animal (cat) (dog) hair and dander: Secondary | ICD-10-CM | POA: Diagnosis not present

## 2021-07-04 DIAGNOSIS — J301 Allergic rhinitis due to pollen: Secondary | ICD-10-CM | POA: Diagnosis not present

## 2021-07-04 DIAGNOSIS — J3089 Other allergic rhinitis: Secondary | ICD-10-CM | POA: Diagnosis not present

## 2021-07-06 DIAGNOSIS — D2272 Melanocytic nevi of left lower limb, including hip: Secondary | ICD-10-CM | POA: Diagnosis not present

## 2021-07-06 DIAGNOSIS — Z85828 Personal history of other malignant neoplasm of skin: Secondary | ICD-10-CM | POA: Diagnosis not present

## 2021-07-06 DIAGNOSIS — D1801 Hemangioma of skin and subcutaneous tissue: Secondary | ICD-10-CM | POA: Diagnosis not present

## 2021-07-06 DIAGNOSIS — L820 Inflamed seborrheic keratosis: Secondary | ICD-10-CM | POA: Diagnosis not present

## 2021-07-06 DIAGNOSIS — L814 Other melanin hyperpigmentation: Secondary | ICD-10-CM | POA: Diagnosis not present

## 2021-07-06 DIAGNOSIS — Z85820 Personal history of malignant melanoma of skin: Secondary | ICD-10-CM | POA: Diagnosis not present

## 2021-07-06 DIAGNOSIS — L281 Prurigo nodularis: Secondary | ICD-10-CM | POA: Diagnosis not present

## 2021-07-06 DIAGNOSIS — D2372 Other benign neoplasm of skin of left lower limb, including hip: Secondary | ICD-10-CM | POA: Diagnosis not present

## 2021-07-07 DIAGNOSIS — Z1231 Encounter for screening mammogram for malignant neoplasm of breast: Secondary | ICD-10-CM | POA: Diagnosis not present

## 2021-07-11 DIAGNOSIS — Z0000 Encounter for general adult medical examination without abnormal findings: Secondary | ICD-10-CM | POA: Diagnosis not present

## 2021-07-11 DIAGNOSIS — Z131 Encounter for screening for diabetes mellitus: Secondary | ICD-10-CM | POA: Diagnosis not present

## 2021-07-11 DIAGNOSIS — J3081 Allergic rhinitis due to animal (cat) (dog) hair and dander: Secondary | ICD-10-CM | POA: Diagnosis not present

## 2021-07-11 DIAGNOSIS — J3089 Other allergic rhinitis: Secondary | ICD-10-CM | POA: Diagnosis not present

## 2021-07-11 DIAGNOSIS — Z23 Encounter for immunization: Secondary | ICD-10-CM | POA: Diagnosis not present

## 2021-07-11 DIAGNOSIS — E7800 Pure hypercholesterolemia, unspecified: Secondary | ICD-10-CM | POA: Diagnosis not present

## 2021-07-11 DIAGNOSIS — J301 Allergic rhinitis due to pollen: Secondary | ICD-10-CM | POA: Diagnosis not present

## 2021-07-13 DIAGNOSIS — J3081 Allergic rhinitis due to animal (cat) (dog) hair and dander: Secondary | ICD-10-CM | POA: Diagnosis not present

## 2021-07-13 DIAGNOSIS — J301 Allergic rhinitis due to pollen: Secondary | ICD-10-CM | POA: Diagnosis not present

## 2021-07-13 DIAGNOSIS — J3089 Other allergic rhinitis: Secondary | ICD-10-CM | POA: Diagnosis not present

## 2021-07-18 DIAGNOSIS — J3081 Allergic rhinitis due to animal (cat) (dog) hair and dander: Secondary | ICD-10-CM | POA: Diagnosis not present

## 2021-07-18 DIAGNOSIS — J3089 Other allergic rhinitis: Secondary | ICD-10-CM | POA: Diagnosis not present

## 2021-07-18 DIAGNOSIS — J301 Allergic rhinitis due to pollen: Secondary | ICD-10-CM | POA: Diagnosis not present

## 2021-07-26 DIAGNOSIS — J3081 Allergic rhinitis due to animal (cat) (dog) hair and dander: Secondary | ICD-10-CM | POA: Diagnosis not present

## 2021-07-26 DIAGNOSIS — J301 Allergic rhinitis due to pollen: Secondary | ICD-10-CM | POA: Diagnosis not present

## 2021-07-26 DIAGNOSIS — J3089 Other allergic rhinitis: Secondary | ICD-10-CM | POA: Diagnosis not present

## 2021-08-01 DIAGNOSIS — J301 Allergic rhinitis due to pollen: Secondary | ICD-10-CM | POA: Diagnosis not present

## 2021-08-01 DIAGNOSIS — R7309 Other abnormal glucose: Secondary | ICD-10-CM | POA: Diagnosis not present

## 2021-08-01 DIAGNOSIS — R635 Abnormal weight gain: Secondary | ICD-10-CM | POA: Diagnosis not present

## 2021-08-01 DIAGNOSIS — J3081 Allergic rhinitis due to animal (cat) (dog) hair and dander: Secondary | ICD-10-CM | POA: Diagnosis not present

## 2021-08-01 DIAGNOSIS — J3089 Other allergic rhinitis: Secondary | ICD-10-CM | POA: Diagnosis not present

## 2021-08-10 DIAGNOSIS — J301 Allergic rhinitis due to pollen: Secondary | ICD-10-CM | POA: Diagnosis not present

## 2021-08-10 DIAGNOSIS — J3081 Allergic rhinitis due to animal (cat) (dog) hair and dander: Secondary | ICD-10-CM | POA: Diagnosis not present

## 2021-08-10 DIAGNOSIS — J3089 Other allergic rhinitis: Secondary | ICD-10-CM | POA: Diagnosis not present

## 2021-08-16 DIAGNOSIS — E663 Overweight: Secondary | ICD-10-CM | POA: Diagnosis not present

## 2021-08-16 DIAGNOSIS — R7303 Prediabetes: Secondary | ICD-10-CM | POA: Diagnosis not present

## 2021-08-16 DIAGNOSIS — M8580 Other specified disorders of bone density and structure, unspecified site: Secondary | ICD-10-CM | POA: Diagnosis not present

## 2021-08-16 DIAGNOSIS — J45909 Unspecified asthma, uncomplicated: Secondary | ICD-10-CM | POA: Diagnosis not present

## 2021-08-22 DIAGNOSIS — J3081 Allergic rhinitis due to animal (cat) (dog) hair and dander: Secondary | ICD-10-CM | POA: Diagnosis not present

## 2021-08-22 DIAGNOSIS — J3089 Other allergic rhinitis: Secondary | ICD-10-CM | POA: Diagnosis not present

## 2021-08-22 DIAGNOSIS — J301 Allergic rhinitis due to pollen: Secondary | ICD-10-CM | POA: Diagnosis not present

## 2021-08-22 DIAGNOSIS — R7303 Prediabetes: Secondary | ICD-10-CM | POA: Diagnosis not present

## 2021-08-22 DIAGNOSIS — E663 Overweight: Secondary | ICD-10-CM | POA: Diagnosis not present

## 2021-08-29 DIAGNOSIS — M7582 Other shoulder lesions, left shoulder: Secondary | ICD-10-CM | POA: Diagnosis not present

## 2021-08-29 DIAGNOSIS — J301 Allergic rhinitis due to pollen: Secondary | ICD-10-CM | POA: Diagnosis not present

## 2021-08-29 DIAGNOSIS — J3089 Other allergic rhinitis: Secondary | ICD-10-CM | POA: Diagnosis not present

## 2021-08-29 DIAGNOSIS — J3081 Allergic rhinitis due to animal (cat) (dog) hair and dander: Secondary | ICD-10-CM | POA: Diagnosis not present

## 2021-09-22 DIAGNOSIS — J3089 Other allergic rhinitis: Secondary | ICD-10-CM | POA: Diagnosis not present

## 2021-09-22 DIAGNOSIS — J3081 Allergic rhinitis due to animal (cat) (dog) hair and dander: Secondary | ICD-10-CM | POA: Diagnosis not present

## 2021-09-22 DIAGNOSIS — J301 Allergic rhinitis due to pollen: Secondary | ICD-10-CM | POA: Diagnosis not present

## 2021-09-26 DIAGNOSIS — R7303 Prediabetes: Secondary | ICD-10-CM | POA: Diagnosis not present

## 2021-09-26 DIAGNOSIS — J45909 Unspecified asthma, uncomplicated: Secondary | ICD-10-CM | POA: Diagnosis not present

## 2021-09-26 DIAGNOSIS — J301 Allergic rhinitis due to pollen: Secondary | ICD-10-CM | POA: Diagnosis not present

## 2021-09-26 DIAGNOSIS — J3081 Allergic rhinitis due to animal (cat) (dog) hair and dander: Secondary | ICD-10-CM | POA: Diagnosis not present

## 2021-09-26 DIAGNOSIS — E663 Overweight: Secondary | ICD-10-CM | POA: Diagnosis not present

## 2021-09-26 DIAGNOSIS — J3089 Other allergic rhinitis: Secondary | ICD-10-CM | POA: Diagnosis not present

## 2021-10-04 DIAGNOSIS — J301 Allergic rhinitis due to pollen: Secondary | ICD-10-CM | POA: Diagnosis not present

## 2021-10-04 DIAGNOSIS — J3081 Allergic rhinitis due to animal (cat) (dog) hair and dander: Secondary | ICD-10-CM | POA: Diagnosis not present

## 2021-10-04 DIAGNOSIS — J3089 Other allergic rhinitis: Secondary | ICD-10-CM | POA: Diagnosis not present

## 2021-10-10 DIAGNOSIS — J301 Allergic rhinitis due to pollen: Secondary | ICD-10-CM | POA: Diagnosis not present

## 2021-10-10 DIAGNOSIS — J3089 Other allergic rhinitis: Secondary | ICD-10-CM | POA: Diagnosis not present

## 2021-10-10 DIAGNOSIS — J3081 Allergic rhinitis due to animal (cat) (dog) hair and dander: Secondary | ICD-10-CM | POA: Diagnosis not present

## 2021-10-18 DIAGNOSIS — J3089 Other allergic rhinitis: Secondary | ICD-10-CM | POA: Diagnosis not present

## 2021-10-18 DIAGNOSIS — J3081 Allergic rhinitis due to animal (cat) (dog) hair and dander: Secondary | ICD-10-CM | POA: Diagnosis not present

## 2021-10-18 DIAGNOSIS — J301 Allergic rhinitis due to pollen: Secondary | ICD-10-CM | POA: Diagnosis not present

## 2021-10-24 DIAGNOSIS — J3089 Other allergic rhinitis: Secondary | ICD-10-CM | POA: Diagnosis not present

## 2021-10-24 DIAGNOSIS — J3081 Allergic rhinitis due to animal (cat) (dog) hair and dander: Secondary | ICD-10-CM | POA: Diagnosis not present

## 2021-10-24 DIAGNOSIS — J301 Allergic rhinitis due to pollen: Secondary | ICD-10-CM | POA: Diagnosis not present

## 2021-10-31 DIAGNOSIS — J3081 Allergic rhinitis due to animal (cat) (dog) hair and dander: Secondary | ICD-10-CM | POA: Diagnosis not present

## 2021-10-31 DIAGNOSIS — J301 Allergic rhinitis due to pollen: Secondary | ICD-10-CM | POA: Diagnosis not present

## 2021-10-31 DIAGNOSIS — J3089 Other allergic rhinitis: Secondary | ICD-10-CM | POA: Diagnosis not present

## 2021-11-08 DIAGNOSIS — J3081 Allergic rhinitis due to animal (cat) (dog) hair and dander: Secondary | ICD-10-CM | POA: Diagnosis not present

## 2021-11-08 DIAGNOSIS — J3089 Other allergic rhinitis: Secondary | ICD-10-CM | POA: Diagnosis not present

## 2021-11-08 DIAGNOSIS — J301 Allergic rhinitis due to pollen: Secondary | ICD-10-CM | POA: Diagnosis not present

## 2022-10-10 ENCOUNTER — Encounter: Payer: Self-pay | Admitting: Gastroenterology

## 2022-11-28 ENCOUNTER — Encounter: Payer: Self-pay | Admitting: Gastroenterology

## 2022-12-27 ENCOUNTER — Ambulatory Visit (AMBULATORY_SURGERY_CENTER): Payer: Medicare PPO | Admitting: *Deleted

## 2022-12-27 VITALS — Ht 66.0 in | Wt 164.0 lb

## 2022-12-27 DIAGNOSIS — Z86010 Personal history of colonic polyps: Secondary | ICD-10-CM

## 2022-12-27 MED ORDER — NA SULFATE-K SULFATE-MG SULF 17.5-3.13-1.6 GM/177ML PO SOLN: 1.0000 | Freq: Once | ORAL | 0 refills | Status: AC

## 2022-12-27 NOTE — Progress Notes (Signed)
Pt's name and DOB verified at the beginning of the pre-visit.  Pt denies any difficulty with ambulating,sitting, laying down or rolling side to side Gave both [facility] main # and MD on call # prior to instructions.  No egg or soy allergy known to patient  No issues known to pt with past sedation with any surgeries or procedures Pt denies having issues being intubated Pt has no issues moving head neck or swallowing No FH of Malignant Hyperthermia Pt is not on diet pills Pt is not on home 02  Pt is not on blood thinners  Pt has frequent issues with constipation RN instructed pt to use Miralax per bottles instructions a week before prep days. Pt states they will Pt is not on dialysis Pt denise any abnormal heart rhythms  Pt denies any upcoming cardiac testing Pt encouraged to use to use Singlecare or Goodrx to reduce cost  Patient's chart reviewed by Cathlyn Parsons CNRA prior to pre-visit and patient appropriate for the [facility].  Pre-visit completed and red dot placed by patient's name on their procedure day (on provider's schedule).  . Visit by phone Pt states weight is 164 lb Instructed pt why it is important to and  to call if they have any changes in health or new medications. Directed them to the # given and on instructions.   Pt states they will.  Instructions reviewed with pt and pt states understanding. Instructed to review again prior to procedure. Pt states they will.  Instructions sent by mail with coupon and by my chart

## 2023-01-08 ENCOUNTER — Encounter: Payer: Self-pay | Admitting: Gastroenterology

## 2023-01-24 ENCOUNTER — Telehealth: Payer: Self-pay | Admitting: *Deleted

## 2023-01-24 ENCOUNTER — Encounter: Payer: Self-pay | Admitting: Gastroenterology

## 2023-01-24 ENCOUNTER — Ambulatory Visit: Payer: Medicare PPO | Admitting: Gastroenterology

## 2023-01-24 VITALS — BP 139/73 | HR 83 | Temp 96.0°F | Ht 66.0 in | Wt 164.0 lb

## 2023-01-24 DIAGNOSIS — Z86010 Personal history of colonic polyps: Secondary | ICD-10-CM

## 2023-01-24 MED ORDER — SODIUM CHLORIDE 0.9 % IV SOLN: 500.0000 mL | INTRAVENOUS | Status: DC

## 2023-01-24 NOTE — Telephone Encounter (Addendum)
Due to incorrect prep instructions, patientt only held phentermine for 7 days prior to colonoscopy rather than required 10 day hold.  Advised pt that she will need to be rescheduled.  She would like to see Dr Myrtie Neither in the office to discuss ongoing reflux issues prior to rescheduling.  She also states that she would prefer to do 2 day miralax prep next time. She does not feel that she can tolerate suprep again.

## 2023-01-24 NOTE — Telephone Encounter (Signed)
Thank you for the note.  To clarify our conversation earlier and for documentation purposes, it appears that Heather Fuller was given incorrect pre-procedure phentermine instructions from the endoscopy pre-procedure visit staff.  I can work her into clinic tomorrow AM for a 9:40 visit with 9:30 arrival.   Clinic slots very limited for next few months.  - H. Danis

## 2023-01-24 NOTE — Progress Notes (Signed)
Pt's states no medical or surgical changes since previsit or office visit.  Patient last took phentermine on 01/16/23.  This is a 10 day hold medication per policy.  Dr. Myrtie Neither is aware and request to have patient's procedure rescheduled.

## 2023-01-24 NOTE — Telephone Encounter (Signed)
Phoned pt and notified her of appointment tomorrow at 9:40 with Dr. Myrtie Neither.

## 2023-01-24 NOTE — Progress Notes (Addendum)
Pt was incorrectly instructed to hold phentermine for 7 days prior to colonoscopy rather than required 10 days. Advised pt that she will need to be rescheduled. She would like to see Dr Myrtie Neither in the office to discuss ongoing reflux issues prior to rescheduling. She also states that she would prefer to do 2 day miralax prep next time. She does not feel that she can tolerate suprep again.

## 2023-01-25 ENCOUNTER — Encounter: Payer: Self-pay | Admitting: Gastroenterology

## 2023-01-25 ENCOUNTER — Ambulatory Visit: Payer: Medicare PPO | Admitting: Gastroenterology

## 2023-01-25 VITALS — BP 140/88 | HR 76 | Ht 66.0 in | Wt 166.0 lb

## 2023-01-25 DIAGNOSIS — K5909 Other constipation: Secondary | ICD-10-CM

## 2023-01-25 DIAGNOSIS — K219 Gastro-esophageal reflux disease without esophagitis: Secondary | ICD-10-CM

## 2023-01-25 DIAGNOSIS — Z86010 Personal history of colonic polyps: Secondary | ICD-10-CM

## 2023-01-25 MED ORDER — FAMOTIDINE 40 MG PO TABS: 40.0000 mg | ORAL_TABLET | Freq: Every day | ORAL | 2 refills | Status: DC

## 2023-01-25 NOTE — Progress Notes (Signed)
[facility] Consult Note:  History: Heather Fuller 01/25/2023  Self-referred  Reason for consult/chief complaint: Colonoscopy (Pt states need re instructed due to miss leading information on a medication hold.)   Subjective  HPI: Summary of GI issues: Surveillance colonoscopy with Dr. 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 She was seen in 2021 for longstanding constipation, previously treated by Dr. Jarold Motto with Amitiza.  (Prescribed Linzess at that visit)   She also had an incidental benign liver lesion found on imaging at the time of acute cholecystitis and followed with MRI.  [name] was scheduled for surveillance colonoscopy with me yesterday, for which she underwent a 2-day preparation due to her history of constipation.  Unfortunately, she appears to have been given incorrect instructions from the scheduling staff regarding unnecessary hold of her phentermine, having been instructed to hold it 7 days when it needs to be 10 days.  Her procedure was therefore canceled and she was understandably upset.  She requested a visit to see me for ongoing reflux issues and to discuss the possibility of a rescheduled colonoscopy. __________________  Heather Fuller again describes lifelong constipation for which she takes herbal laxative that generally works well for her.  Samples of Linzess given at a prior office visit only caused explosive watery diarrhea, so she did not wish to try something like that again. She wishes to reschedule her colonoscopy, but is adamant that she will not take Suprep or a 2-day prep again. She also one discussed reflux symptoms, and describes how years ago she had intermittent heartburn that became more frequent and she went on once daily Prilosec.  The seem to control the symptoms well and her gynecologist recommended she slowly wean off it.  She did that and says she was well for a few years only to have  recurrence of symptoms this year.  Because of the previous advice she received, she started taking Tums regularly, then went to primary care and they told her to stop doing that out of concern for excess calcium ingestion.  So she feels confused about conflicting recommendations and uncertain how to treat it. She denies dysphagia or odontophagia nausea or vomiting.  Denies rectal bleeding.   ROS:  Review of Systems  Constitutional:  Negative for appetite change and unexpected weight change.  HENT:  Negative for mouth sores and voice change.   Eyes:  Negative for pain and redness.  Respiratory:  Negative for cough and shortness of breath.   Cardiovascular:  Negative for chest pain and palpitations.  Genitourinary:  Negative for dysuria and hematuria.  Musculoskeletal:  Negative for arthralgias and myalgias.  Skin:  Negative for pallor and rash.  Allergic/Immunologic: Positive for environmental allergies.  Neurological:  Negative for weakness and headaches.  Hematological:  Negative for adenopathy.     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)    Osteopenia      Past Surgical History: Past Surgical History:  Procedure Laterality Date   ABDOMINAL HYSTERECTOMY     CHOLECYSTECTOMY N/A 07/06/2019   Procedure: LAPAROSCOPIC CHOLECYSTECTOMY WITH  INTRAOPERATIVE CHOLANGIOGRAM;  Surgeon: Griselda Miner, MD;  Location: [facility];  Service: General;  Laterality: N/A;  MELANOMA EXCISION     mylemectomy       Family History: Family History  Problem Relation Age of Onset   Diabetes Mother    Colon cancer Maternal Aunt    Colon polyps Neg Hx    Esophageal cancer Neg Hx    Pancreatic cancer Neg Hx    Rectal cancer Neg Hx    Stomach cancer Neg Hx     Social  History: Social History   Socioeconomic History   Marital status: Married    Spouse name: Not on file   Number of children: Not on file   Years of education: Not on file   Highest education level: Not on file  Occupational History   Not on file  Tobacco Use   Smoking status: Never   Smokeless tobacco: Never  Vaping Use   Vaping status: Never Used  Substance and Sexual Activity   Alcohol use: Not Currently    Alcohol/week: 4.0 standard drinks of alcohol    Types: 4 Glasses of wine per week    Comment: one a day   Drug use: No   Sexual activity: Not on file  Other Topics Concern   Not on file  Social History Narrative   Not on file   Social Determinants of Health   Financial Resource Strain: Not on file  Food Insecurity: Not on file  Transportation Needs: Not on file  Physical Activity: Not on file  Stress: Not on file  Social Connections: Not on file    Allergies: Allergies  Allergen Reactions   Cat Hair Extract Itching and Shortness Of Breath   Latex     Skin blisters   Morphine Nausea And Vomiting    REACTION: severe nauseaa and vomiting    Outpatient Meds: Current Outpatient Medications  Medication Sig Dispense Refill   famotidine (PEPCID) 40 MG tablet Take 1 tablet (40 mg total) by mouth daily. 30 tablet 2   azelastine (OPTIVAR) 0.05 % ophthalmic solution INSTILL 1 DROP INTO AFFECTED EYE TWICE A DAY 30     budesonide-formoterol (SYMBICORT) 160-4.5 MCG/ACT inhaler Inhale 2 puffs into the lungs as needed. (Patient not taking: Reported on 12/27/2022)     EPINEPHrine 0.3 mg/0.3 mL IJ SOAJ injection USE AS DIRECTED 30 (Patient not taking: Reported on 01/24/2023)     ipratropium (ATROVENT) 0.06 % nasal spray Place 2 sprays into both nostrils 3 (three) times daily.   3   levalbuterol (XOPENEX HFA) 45 MCG/ACT inhaler Inhale 1-2 puffs into the lungs as needed for wheezing. (Patient not taking: Reported on 12/27/2022)     levocetirizine (XYZAL) 5 MG tablet Take 5 mg by  mouth every evening.     linaclotide (LINZESS) 72 MCG capsule Take 1 capsule (72 mcg total) by mouth daily before breakfast. (Patient not taking: Reported on 12/27/2022) 30 capsule 2   montelukast (SINGULAIR) 10 MG tablet Take 10 mg by mouth at bedtime.     omeprazole (PRILOSEC) 20 MG capsule Take 20 mg by mouth every morning.     phentermine (ADIPEX-P) 37.5 MG tablet Take 18.75-37.5 mg by mouth daily.     topiramate (TOPAMAX) 25 MG tablet Take 25 mg by mouth daily.     valACYclovir (VALTREX) 1000 MG tablet TAKE 1 TABLET AS DIRECTED, TAKE 2 TABS THIS AFTERNOON, THEN 2 ADDITIONAL TABS THIS EVENING.     Current Facility-Administered Medications  Medication Dose Route Frequency Provider Last Rate Last Admin   0.9 %  sodium chloride infusion  500 mL  Intravenous Continuous Sherrilyn Rist, MD          ___________________________________________________________________ Objective   Exam:  BP (!) 140/88   Pulse 76   Ht 5\' 6"  (1.676 m)   Wt 166 lb (75.3 kg)   BMI 26.79 kg/m  Wt Readings from Last 3 Encounters:  01/25/23 166 lb (75.3 kg)  01/24/23 164 lb (74.4 kg)  12/27/22 164 lb (74.4 kg)    General: Well-appearing Eyes: sclera anicteric, no redness ENT: oral mucosa moist without lesions, no cervical or supraclavicular lymphadenopathy CV: Regular without appreciable murmur, no JVD, no peripheral edema Resp: clear to auscultation bilaterally, normal RR and effort noted GI: soft, no tenderness, with active bowel sounds. No guarding or palpable organomegaly noted. Skin; warm and dry, no rash or jaundice noted Neuro: awake, alert and oriented x 3. Normal gross motor function and fluent speech  Labs:  No recent data for review Assessment: Encounter Diagnoses  Name Primary?   Gastroesophageal reflux disease, unspecified whether esophagitis present Yes   Chronic constipation    Personal history of colonic polyps     Years of reflux symptoms that previously required daily PPI,  then she was well for years off that medicine and then had recurrence of symptoms with heartburn and occasionally regurgitation. She was advised to stop PPI, presumably out of concern that it might affect calcium absorption, but she also read that it could affect kidney function.  I agree that frequent calcium carbonate ingestion would not be ideal for this treatment due to the risk of kidney stones. I recommended she try famotidine 40 mg once daily for at least the next 8 weeks, and a prescription was given. She has longstanding slow transit constipation and melanosis coli.  This was the reason she had a 2-day bowel preparation for the prior colonoscopy and for the 1 that was planned yesterday.  However, she dislikes that experience and requested to use a MiraLAX bowel preparation.  She was confident that having taken that is the first part of the 2-day prep because sufficient output, and she is agreeable to doing that for her rescheduled colonoscopy.   Plan:  Medicine plan as noted above  Diet and lifestyle antireflux measures  EGD and colonoscopy.  Upper endoscopy warranted to evaluate the reflux symptoms, evaluate for hiatal hernia, reflux esophagitis, gastric outlet obstruction.  Colonoscopy warranted for polyp surveillance.  She was agreeable after discussion of procedure and risks.  The benefits and risks of the planned procedure were described in detail with the patient or (when appropriate) their health care proxy.  Risks were outlined as including, but not limited to, bleeding, infection, perforation, adverse medication reaction leading to cardiac or pulmonary decompensation, pancreatitis (if ERCP).  The limitation of incomplete mucosal visualization was also discussed.  No guarantees or warranties were given.  All prep instructions including 10-day hold of phentermine were given at today's visit.  Thank you for the courtesy of this consult.  Please call me with any questions or  concerns.  Charlie Pitter III  CC: Referring provider noted above

## 2023-01-25 NOTE — Patient Instructions (Signed)
_______________________________________________________  If your blood pressure at your visit was 140/90 or greater, please contact your primary care physician to follow up on this.  _______________________________________________________  If you are age 68 or older, your body mass index should be between 23-30. Your Body mass index is 26.79 kg/m. If this is out of the aforementioned range listed, please consider follow up with your Primary Care Provider.  If you are age 86 or younger, your body mass index should be between 19-25. Your Body mass index is 26.79 kg/m. If this is out of the aformentioned range listed, please consider follow up with your Primary Care Provider.   ________________________________________________________  The [facility] providers would like to encourage you to use [facility] to communicate with providers for non-urgent requests or questions.  Due to long hold times on the telephone, sending your provider a message by [facility] may be a faster and more efficient way to get a response.  Please allow 48 business hours for a response.  Please remember that this is for non-urgent requests.  _______________________________________________________  Bonita Quin have been scheduled for a colonoscopy. Please follow written instructions given to you at your visit today.   Please pick up your prep supplies at the pharmacy within the next 1-3 days.  If you use inhalers (even only as needed), please bring them with you on the day of your procedure.  DO NOT TAKE 7 DAYS PRIOR TO TEST- Trulicity (dulaglutide) Ozempic, Wegovy (semaglutide) Mounjaro (tirzepatide) Bydureon Bcise (exanatide extended release)  DO NOT TAKE 1 DAY PRIOR TO YOUR TEST Rybelsus (semaglutide) Adlyxin (lixisenatide) Victoza (liraglutide) Byetta (exanatide) ___________________________________________________________________________  Due to recent changes in healthcare laws, you may see the results of your imaging  and laboratory studies on MyChart before your provider has had a chance to review them.  We understand that in some cases there may be results that are confusing or concerning to you. Not all laboratory results come back in the same time frame and the provider may be waiting for multiple results in order to interpret others.  Please give [place] 48 hours in order for your provider to thoroughly review all the results before contacting the office for clarification of your results.   HOLD PHENTERMINE for 10 days before the colonoscopy.  It was a pleasure to see you today!  Thank you for trusting me with your gastrointestinal care!

## 2023-02-01 ENCOUNTER — Encounter: Payer: Self-pay | Admitting: Gastroenterology

## 2023-02-01 ENCOUNTER — Telehealth: Payer: Self-pay | Admitting: Gastroenterology

## 2023-02-01 NOTE — Telephone Encounter (Signed)
Lm on vm for patient to return call

## 2023-02-01 NOTE — Telephone Encounter (Signed)
Inbound call from patient, expressed that she would like to start Linzess medication again. Patient did not wish to explain further. Please advise.

## 2023-02-04 NOTE — Telephone Encounter (Signed)
Patient sent a MyChart message with the same request. I responded to patient via MyChart and currently awaiting a response.

## 2023-02-07 ENCOUNTER — Ambulatory Visit: Payer: Medicare PPO | Admitting: Gastroenterology

## 2023-02-07 ENCOUNTER — Encounter: Payer: Self-pay | Admitting: Gastroenterology

## 2023-02-07 VITALS — BP 129/72 | HR 67 | Temp 97.5°F | Resp 13 | Ht 66.0 in | Wt 166.0 lb

## 2023-02-07 DIAGNOSIS — D124 Benign neoplasm of descending colon: Secondary | ICD-10-CM | POA: Diagnosis not present

## 2023-02-07 DIAGNOSIS — D123 Benign neoplasm of transverse colon: Secondary | ICD-10-CM

## 2023-02-07 DIAGNOSIS — K222 Esophageal obstruction: Secondary | ICD-10-CM | POA: Diagnosis not present

## 2023-02-07 DIAGNOSIS — K635 Polyp of colon: Secondary | ICD-10-CM | POA: Diagnosis not present

## 2023-02-07 DIAGNOSIS — K2100 Gastro-esophageal reflux disease with esophagitis, without bleeding: Secondary | ICD-10-CM | POA: Diagnosis not present

## 2023-02-07 DIAGNOSIS — Z86010 Personal history of colonic polyps: Secondary | ICD-10-CM | POA: Diagnosis not present

## 2023-02-07 DIAGNOSIS — D122 Benign neoplasm of ascending colon: Secondary | ICD-10-CM

## 2023-02-07 DIAGNOSIS — Z09 Encounter for follow-up examination after completed treatment for conditions other than malignant neoplasm: Secondary | ICD-10-CM | POA: Diagnosis not present

## 2023-02-07 DIAGNOSIS — K219 Gastro-esophageal reflux disease without esophagitis: Secondary | ICD-10-CM

## 2023-02-07 MED ORDER — SODIUM CHLORIDE 0.9 % IV SOLN: 500.0000 mL | Freq: Once | INTRAVENOUS | Status: DC

## 2023-02-07 NOTE — Progress Notes (Signed)
No changes to clinical history since GI office visit on 01/25/23. No significant changes were identified.  The patient continues to be an appropriate candidate for the planned procedure and anesthesia.   The patient is appropriate for an endoscopic procedure in the ambulatory setting.  - Amada Jupiter, MD

## 2023-02-07 NOTE — Patient Instructions (Signed)
Thank you for letting us take care of your healthcare needs today. Please see handouts given to you on Polyps.    YOU HAD AN ENDOSCOPIC PROCEDURE TODAY AT THE [facility]:   Refer to the procedure report that was given to you for any specific questions about what was found during the examination.  If the procedure report does not answer your questions, please call your gastroenterologist to clarify.  If you requested that your care partner not be given the details of your procedure findings, then the procedure report has been included in a sealed envelope for you to review at your convenience later.  YOU SHOULD EXPECT: Some feelings of bloating in the abdomen. Passage of more gas than usual.  Walking can help get rid of the air that was put into your GI tract during the procedure and reduce the bloating. If you had a lower endoscopy (such as a colonoscopy or flexible sigmoidoscopy) you may notice spotting of blood in your stool or on the toilet paper. If you underwent a bowel prep for your procedure, you may not have a normal bowel movement for a few days.  Please Note:  You might notice some irritation and congestion in your nose or some drainage.  This is from the oxygen used during your procedure.  There is no need for concern and it should clear up in a day or so.  SYMPTOMS TO REPORT IMMEDIATELY:  Following lower endoscopy (colonoscopy or flexible sigmoidoscopy):  Excessive amounts of blood in the stool  Significant tenderness or worsening of abdominal pains  Swelling of the abdomen that is new, acute  Fever of 100F or higher  Following upper endoscopy (EGD)  Vomiting of blood or coffee ground material  New chest pain or pain under the shoulder blades  Painful or persistently difficult swallowing  New shortness of breath  Fever of 100F or higher  Black, tarry-looking stools  For urgent or emergent issues, a gastroenterologist can be reached at any hour by calling [phone]  [phone]. Do not use MyChart messaging for urgent concerns.    DIET:  We do recommend a small meal at first, but then you may proceed to your regular diet.  Drink plenty of fluids but you should avoid alcoholic beverages for 24 hours.  ACTIVITY:  You should plan to take it easy for the rest of today and you should NOT DRIVE or use heavy machinery until tomorrow (because of the sedation medicines used during the test).    FOLLOW UP: Our staff will call the number listed on your records the next business day following your procedure.  We will call around 7:15- 8:00 am to check on you and address any questions or concerns that you may have regarding the information given to you following your procedure. If we do not reach you, we will leave a message.     If any biopsies were taken you will be contacted by phone or by letter within the next 1-3 weeks.  Please call us at [phone] if you have not heard about the biopsies in 3 weeks.    SIGNATURES/CONFIDENTIALITY: You and/or your care partner have signed paperwork which will be entered into your electronic medical record.  These signatures attest to the fact that that the information above on your After Visit Summary has been reviewed and is understood.  Full responsibility of the confidentiality of this discharge information lies with you and/or your care-partner.

## 2023-02-07 NOTE — Op Note (Signed)
[facility] Patient Name: Heather Fuller Procedure Date: 02/07/2023 3:13 PM MRN: [id number] Endoscopist: Sherilyn Cooter L. Myrtie Neither , MD, [id number] Age: 67 Referring MD:  Date of Birth: 1956/02/24 Gender: Female Account #: [id number] Procedure:                Upper GI endoscopy Indications:              Esophageal reflux symptoms that persist despite                            appropriate therapy Medicines:                Monitored Anesthesia Care Procedure:                Pre-Anesthesia Assessment:                           - Prior to the procedure, a History and Physical                            was performed, and patient medications and                            allergies were reviewed. The patient's tolerance of                            previous anesthesia was also reviewed. The risks                            and benefits of the procedure and the sedation                            options and risks were discussed with the patient.                            All questions were answered, and informed consent                            was obtained. Prior Anticoagulants: The patient has                            taken no anticoagulant or antiplatelet agents. ASA                            Grade Assessment: II - A patient with mild systemic                            disease. After reviewing the risks and benefits,                            the patient was deemed in satisfactory condition to                            undergo the procedure.  After obtaining informed consent, the endoscope was                            passed under direct vision. Throughout the                            procedure, the patient's blood pressure, pulse, and                            oxygen saturations were monitored continuously. The                            GIF [id number] #[id number] was introduced through the                            mouth, and advanced to the second part of  duodenum.                            The upper GI endoscopy was accomplished without                            difficulty. The patient tolerated the procedure                            well. Scope In: Scope Out: Findings:                 A widely patent Schatzki ring was found at the                            gastroesophageal junction.                           A 1-2 cm(axial) sliding hiatal hernia was present.                            EGJ somewhat patulous.                           LA Grade A (one or more mucosal breaks less than 5                            mm, not extending between tops of 2 mucosal folds)                            esophagitis with no bleeding was found in the lower                            third of the esophagus.                           The stomach was normal. Distended well with                            insufflation.  The cardia and gastric fundus were normal on                            retroflexion. (Hill grade 3-4)                           The examined duodenum was normal. Complications:            No immediate complications. Estimated Blood Loss:     Estimated blood loss: none. Impression:               - Widely patent Schatzki ring.                           - 1-2 cm hiatal hernia.                           - LA Grade A reflux esophagitis with no bleeding.                           - Normal stomach.                           - Normal examined duodenum.                           - No specimens collected. Recommendation:           - Patient has a contact number available for                            emergencies. The signs and symptoms of potential                            delayed complications were discussed with the                            patient. Return to normal activities tomorrow.                            Written discharge instructions were provided to the                            patient.                            - Resume previous diet.                           - Continue present medications. Consider increase                            of famotidine to 40 mg twice daily.                           - Follow up with my office to discuss reflux  treatment options, particularly medicines vs                            endoscopic or surgical solution.                           - See the other procedure note for documentation of                            additional recommendations. Rasaan Brotherton L. Myrtie Neither, MD 02/07/2023 3:35:08 PM This report has been signed electronically.

## 2023-02-07 NOTE — Progress Notes (Signed)
Pt's states no medical or surgical changes since previsit or office visit. 

## 2023-02-07 NOTE — Progress Notes (Signed)
Chin lift performed during EGD. Short chin. Uneventful anesthetic. Report to pacu rn. Vss. Care resumed by rn.

## 2023-02-07 NOTE — Op Note (Signed)
[facility] Patient Name: Heather Fuller Procedure Date: 02/07/2023 3:10 PM MRN: [id number] Endoscopist: Sherilyn Cooter L. Myrtie Neither , MD, [id number] Age: 67 Referring MD:  Date of Birth: August 02, 1955 Gender: Female Account #: [id number] Procedure:                Colonoscopy Indications:              Surveillance: Personal history of adenomatous                            polyps on last colonoscopy 5 years ago Medicines:                Monitored Anesthesia Care Procedure:                Pre-Anesthesia Assessment:                           - Prior to the procedure, a History and Physical                            was performed, and patient medications and                            allergies were reviewed. The patient's tolerance of                            previous anesthesia was also reviewed. The risks                            and benefits of the procedure and the sedation                            options and risks were discussed with the patient.                            All questions were answered, and informed consent                            was obtained. Prior Anticoagulants: The patient has                            taken no anticoagulant or antiplatelet agents. ASA                            Grade Assessment: II - A patient with mild systemic                            disease. After reviewing the risks and benefits,                            the patient was deemed in satisfactory condition to                            undergo the procedure.  After obtaining informed consent, the colonoscope                            was passed under direct vision. Throughout the                            procedure, the patient's blood pressure, pulse, and                            oxygen saturations were monitored continuously. The                            CF [id number] #[id number] was introduced through the anus                            and advanced to the the  cecum, identified by                            appendiceal orifice and ileocecal valve. The                            colonoscopy was performed without difficulty. The                            patient tolerated the procedure well. The quality                            of the bowel preparation was good. The ileocecal                            valve, appendiceal orifice, and rectum were                            photographed. The bowel preparation used was                            Miralax via split dose instruction (patient choice,                            tolerated better than Suprep previously). Scope In: 3:36:36 PM Scope Out: 3:53:28 PM Scope Withdrawal Time: 0 hours 11 minutes 59 seconds  Total Procedure Duration: 0 hours 16 minutes 52 seconds  Findings:                 The perianal and digital rectal examinations were                            normal.                           Repeat examination of right colon under NBI                            performed.  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                           A diminutive polyp was found in the proximal                            ascending colon. The polyp was flat. The polyp was                            removed with a cold biopsy forceps. Resection and                            retrieval were complete. (Jar 1)                           Three flat and sessile polyps were found in the                            descending colon and transverse colon. The polyps                            were 3 to 6 mm in size. These polyps were removed                            with a cold snare. Resection and retrieval were                            complete. (Jar 2)                           The exam was otherwise without abnormality on                            direct and retroflexion views. Complications:            No immediate complications. Estimated Blood Loss:     Estimated  blood loss was minimal. Impression:               - Melanosis in the colon.                           - One diminutive polyp in the proximal ascending                            colon, removed with a cold biopsy forceps. Resected                            and retrieved.                           - Three 3 to 6 mm polyps in the descending colon                            and in the transverse colon, removed with  a cold                            snare. Resected and retrieved.                           - The examination was otherwise normal on direct                            and retroflexion views. Recommendation:           - Patient has a contact number available for                            emergencies. The signs and symptoms of potential                            delayed complications were discussed with the                            patient. Return to normal activities tomorrow.                            Written discharge instructions were provided to the                            patient.                           - Resume previous diet.                           - Continue present medications.                           - Await pathology results.                           - Repeat colonoscopy is recommended for                            surveillance. The colonoscopy date will be                            determined after pathology results from today's                            exam become available for review.                           - Return to my office at appointment to be                            scheduled to discuss options of prescription                            medicine for chronic constipation. (Amitiza and  Linzess ineffective or limited by loose stool).                            Consider Trulance, Allena Napoleon, Motegrity depending on                            preference, availability and cost. Matyas Baisley L. Myrtie Neither, MD 02/07/2023 4:03:33  PM This report has been signed electronically.

## 2023-02-07 NOTE — Progress Notes (Signed)
Called to room to assist during endoscopic procedure.  Patient ID and intended procedure confirmed with present staff. Received instructions for my participation in the procedure from the performing physician.  

## 2023-02-08 ENCOUNTER — Telehealth: Payer: Self-pay | Admitting: *Deleted

## 2023-02-08 NOTE — Telephone Encounter (Signed)
  Follow up Call-     02/07/2023    2:36 PM 01/24/2023   10:52 AM  Call back number  Post procedure Call Back phone  # [phone]  Permission to leave phone message Yes Yes    Post procedure follow up phone call. No answer at number given.  Left message on voicemail.

## 2023-02-13 ENCOUNTER — Encounter: Payer: Self-pay | Admitting: Gastroenterology

## 2023-02-13 LAB — SURGICAL PATHOLOGY

## 2023-03-07 ENCOUNTER — Encounter: Payer: Self-pay | Admitting: Gastroenterology

## 2023-03-07 ENCOUNTER — Other Ambulatory Visit: Payer: Self-pay

## 2023-03-07 ENCOUNTER — Ambulatory Visit: Payer: Medicare PPO | Admitting: Gastroenterology

## 2023-03-07 VITALS — BP 122/82 | HR 100 | Ht 66.0 in | Wt 170.0 lb

## 2023-03-07 DIAGNOSIS — K5909 Other constipation: Secondary | ICD-10-CM

## 2023-03-07 DIAGNOSIS — K449 Diaphragmatic hernia without obstruction or gangrene: Secondary | ICD-10-CM | POA: Diagnosis not present

## 2023-03-07 DIAGNOSIS — K219 Gastro-esophageal reflux disease without esophagitis: Secondary | ICD-10-CM | POA: Diagnosis not present

## 2023-03-07 DIAGNOSIS — Z860100 Personal history of colon polyps, unspecified: Secondary | ICD-10-CM | POA: Diagnosis not present

## 2023-03-07 NOTE — Patient Instructions (Addendum)
We have given you samples of the following medication to take: Trulance  _______________________________________________________  If your blood pressure at your visit was 140/90 or greater, please contact your primary care physician to follow up on this.  _______________________________________________________  If you are age 68 or older, your body mass index should be between 23-30. Your Body mass index is 27.44 kg/m. If this is out of the aforementioned range listed, please consider follow up with your Primary Care Provider.  If you are age 25 or younger, your body mass index should be between 19-25. Your Body mass index is 27.44 kg/m. If this is out of the aformentioned range listed, please consider follow up with your Primary Care Provider.   ________________________________________________________  The [facility] providers would like to encourage you to use [facility] to communicate with providers for non-urgent requests or questions.  Due to long hold times on the telephone, sending your provider a message by [facility] may be a faster and more efficient way to get a response.  Please allow 48 business hours for a response.  Please remember that this is for non-urgent requests.  _______________________________________________________ It was a pleasure to see you today!  Thank you for trusting me with your gastrointestinal care!

## 2023-03-07 NOTE — Progress Notes (Signed)
[facility] Progress Note  Chief Complaint: GERD follow-up  Subjective  History: Gracin was here to see me after her September office visit and subsequent EGD and colonoscopy.  Details of her heartburn and reflux history outlined in the last office note.  Periodic courses of PPI have been helpful to relieve symptoms, but she was concerned about the potential long-term risks of those medicines.  She was prescribed famotidine at the most recent office visit.  Upper endoscopy showed a small hiatal hernia and grade a reflux esophagitis without Barrett's or stricture.  Longstanding constipation.  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  3-year recall recommended. Prior trials of Linzess and Amitiza caused loose stool. __________  Meriam Sprague reports generally good heartburn control on daily famotidine, though sometimes she skips it and other times she may take OTC omeprazole.  She had some increased heartburn while traveling recently.  Her constipation was also worse during her recent travel to [place], and she finds that to often be the case when she travels.  She will continue her herbal supplement for constipation (most likely Senokot based on finding of melanosis coli), and add as needed MiraLAX. Denies dysphagia or odynophagia.  Appetite good and weight stable.  ROS: Cardiovascular:  no chest pain Respiratory: no dyspnea  The patient's Past Medical, Family and Social History were reviewed and are on file in the EMR.  Objective:  Med list reviewed  Current Outpatient Medications:    albuterol (VENTOLIN HFA) 108 (90 Base) MCG/ACT inhaler, Inhale 1-2 puffs into the lungs every 6 (six) hours as needed., Disp: , Rfl:    azelastine (OPTIVAR) 0.05 % ophthalmic solution, INSTILL 1 DROP INTO AFFECTED EYE TWICE A DAY 30, Disp: , Rfl:    budesonide-formoterol (SYMBICORT) 160-4.5 MCG/ACT inhaler, Inhale 2 puffs into the  lungs as needed., Disp: , Rfl:    EPINEPHrine 0.3 mg/0.3 mL IJ SOAJ injection, , Disp: , Rfl:    famotidine (PEPCID) 40 MG tablet, Take 1 tablet (40 mg total) by mouth daily., Disp: 30 tablet, Rfl: 2   ipratropium (ATROVENT) 0.06 % nasal spray, Place 2 sprays into both nostrils 3 (three) times daily. , Disp: , Rfl: 3   levalbuterol (XOPENEX HFA) 45 MCG/ACT inhaler, Inhale 1-2 puffs into the lungs as needed for wheezing., Disp: , Rfl:    levocetirizine (XYZAL) 5 MG tablet, Take 5 mg by mouth every evening., Disp: , Rfl:    linaclotide (LINZESS) 72 MCG capsule, Take 1 capsule (72 mcg total) by mouth daily before breakfast., Disp: 30 capsule, Rfl: 2   montelukast (SINGULAIR) 10 MG tablet, Take 10 mg by mouth at bedtime., Disp: , Rfl:    phentermine (ADIPEX-P) 37.5 MG tablet, Take 18.75-37.5 mg by mouth daily., Disp: , Rfl:    predniSONE (STERAPRED UNI-PAK 21 TAB) 10 MG (21) TBPK tablet, Take 10 mg by mouth. Prescribed as needed for traveling, Disp: , Rfl:    topiramate (TOPAMAX) 25 MG tablet, Take 25 mg by mouth daily., Disp: , Rfl:    valACYclovir (VALTREX) 1000 MG tablet, TAKE 1 TABLET AS DIRECTED, TAKE 2 TABS THIS AFTERNOON, THEN 2 ADDITIONAL TABS THIS EVENING., Disp: , Rfl:    Vital signs in last 24 hrs: Vitals:   03/07/23 1317  BP: 122/82  Pulse: 100   Wt Readings from Last 3 Encounters:  03/07/23 170 lb (77.1 kg)  02/07/23 166 lb (75.3 kg)  01/25/23 166 lb (75.3 kg)  Physical Exam  No additional exam, entire visit spent in review of record symptoms and plan  Labs:   ___________________________________________ Radiologic studies:   ____________________________________________ Other: : Pathology on file and reviewed with patient (letter had also been sent)  _____________________________________________ Assessment & Plan  Assessment: Encounter Diagnoses  Name Primary?   Gastroesophageal reflux disease without esophagitis Yes   Hiatal hernia    Chronic constipation     History of colonic polyps    Reflux esophagitis, small hiatal hernia, better symptom control when she takes acid suppression regularly.  She would prefer to take famotidine for the most part out of concerns for long-term use of PPI therapy.  I told her I feel it would be low risk to take a PPI periodically for short courses if she finds that the famotidine is not sufficiently controlling symptoms.  I would not advocate endoscopic or surgical therapy for the reflux at this point as long as she continues to have good control with medicines diet and lifestyle. Chronic constipation, longtime problem for which she has taken a stimulant laxative.  Linzess with undesirable side effects, Amitiza too costly for insurance reasons.  She would like to try something else and see if it gives any better results another thing she has used.  I gave her some samples of Trulance 3 mg once daily.  If it does not seem to work well or has side effects such as loose stool, we can try [place] or Motegrity depending on insurance coverage and cost issues.  Next surveillance colonoscopy in 3 years.  She will contact us in a few weeks with an update on the trial of Trulance.   22 minutes were spent on this encounter (including chart review, history/exam, counseling/coordination of care, and documentation) > 50% of that time was spent on counseling and coordination of care.   Heather Fuller

## 2023-04-23 ENCOUNTER — Other Ambulatory Visit: Payer: Self-pay | Admitting: Gastroenterology

## 2023-10-16 ENCOUNTER — Other Ambulatory Visit: Payer: Self-pay | Admitting: Gastroenterology

## 2024-01-12 ENCOUNTER — Other Ambulatory Visit: Payer: Self-pay | Admitting: Gastroenterology
